# Patient Record
Sex: Male | Born: 1960 | Race: White | Hispanic: No | Smoking: Never smoker
Health system: Southern US, Community
[De-identification: ages and names within clinical notes are randomized; demographics above are authoritative.]

## PROBLEM LIST (undated history)

## (undated) HISTORY — PX: APPENDECTOMY: SHX54

## (undated) HISTORY — PX: HERNIA REPAIR: SHX51

---

## 2006-03-05 ENCOUNTER — Ambulatory Visit: Payer: Self-pay | Admitting: Gastroenterology

## 2007-04-06 ENCOUNTER — Ambulatory Visit: Payer: Self-pay | Admitting: Gastroenterology

## 2011-08-13 ENCOUNTER — Encounter: Payer: Self-pay | Admitting: Emergency Medicine

## 2011-08-21 ENCOUNTER — Encounter: Payer: Self-pay | Admitting: Emergency Medicine

## 2011-08-30 ENCOUNTER — Emergency Department: Payer: Self-pay | Admitting: Emergency Medicine

## 2011-09-08 ENCOUNTER — Ambulatory Visit: Payer: Self-pay | Admitting: Sports Medicine

## 2011-12-08 ENCOUNTER — Ambulatory Visit: Payer: Self-pay | Admitting: Cardiology

## 2012-07-28 ENCOUNTER — Ambulatory Visit: Payer: Self-pay | Admitting: Otolaryngology

## 2013-04-07 ENCOUNTER — Ambulatory Visit: Payer: Self-pay | Admitting: Family Medicine

## 2014-07-29 ENCOUNTER — Emergency Department (INDEPENDENT_AMBULATORY_CARE_PROVIDER_SITE_OTHER)
Admission: EM | Admit: 2014-07-29 | Discharge: 2014-07-30 | Disposition: A | Discharge status: Home / Self Care | Payer: 59 | Source: Home / Self Care | Attending: Family Medicine | Admitting: Family Medicine

## 2014-07-29 ENCOUNTER — Encounter: Payer: Self-pay | Admitting: Emergency Medicine

## 2014-07-29 DIAGNOSIS — M545 Low back pain, unspecified: Secondary | ICD-10-CM

## 2014-07-29 MED ORDER — CYCLOBENZAPRINE HCL 10 MG PO TABS: 10.0000 mg | ORAL_TABLET | Freq: Two times a day (BID) | ORAL | Status: DC | PRN

## 2014-07-29 MED ORDER — MELOXICAM 7.5 MG PO TABS: 7.5000 mg | ORAL_TABLET | Freq: Every day | ORAL | Status: DC

## 2014-07-29 NOTE — ED Notes (Signed)
[facility]/O pain in the lower back times14 days denies injury or problems voiding.

## 2014-07-29 NOTE — ED Provider Notes (Signed)
CSN: [id number]     Arrival date & time 07/29/14  1502 History   First MD Initiated Contact with Patient 07/29/14 1505     Chief Complaint  Patient presents with  . Back Pain   (Consider location/radiation/quality/duration/timing/severity/associated sxs/prior Treatment) HPI  Patient is a 54 year old male presenting to urgent care with complaints of constant, waxing and waning lower back pain for last 2 weeks.  Pain is achy, sore and sharp in nature at times.  Pain is 8/10 in severity at its worst.  Pain is worse with certain movements in certain positions.  Patient states back pain is worse when he gets up in the morning as he feels stiff and then it improves throughout the day as his body loosens up.  Patient states he has been sleeping on a futon for last 4 weeks.  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  Not recall any specific injuries or falls.  Denies prior history of significant back problems.  Denies history of back surgeries.  Denies pain radiating to legs or arms.  No numbness or tingling in his arms or legs.  Denies change in bowel or bladder.  Denies abdominal pain, fever, chills, nausea, vomiting, or diarrhea.  Denies change in balance or gait.  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  Denies history of cancer.  History reviewed. No pertinent past medical history. History reviewed. No pertinent past surgical history. History reviewed. No pertinent family history. History  Substance Use Topics  . Smoking status: Never Smoker   . Smokeless tobacco: Not on file  . Alcohol Use: No    Review of Systems  Constitutional: Negative for fever and chills.  Respiratory: Negative for cough and shortness of breath.   Cardiovascular: Negative for chest pain and palpitations.  Gastrointestinal: Negative for nausea, vomiting, abdominal pain and diarrhea.  Genitourinary: Negative for  dysuria, urgency, hematuria and flank pain.  Musculoskeletal: Positive for back pain. Negative for myalgias, joint swelling, arthralgias, gait problem, neck pain and neck stiffness.  Skin: Negative for rash and wound.  Neurological: Negative for weakness and numbness.  All other systems reviewed and are negative.   Allergies  Review of patient's allergies indicates not on file.  Home Medications   Prior to Admission medications   Medication Sig Start Date End Date Taking? Authorizing Provider  cyclobenzaprine (FLEXERIL) 10 MG tablet Take 1 tablet (10 mg total) by mouth 2 (two) times daily as needed for muscle spasms. 07/29/14   Junius FinnerErin O'Malley, PA-C  meloxicam (MOBIC) 7.5 MG tablet Take 1 tablet (7.5 mg total) by mouth daily. 07/29/14   Junius FinnerErin O'Malley, PA-C   BP 138/91 mmHg  Pulse 101  Temp(Src) 98.5 F (36.9 C) (Oral)  Resp 18  Ht 5\' 10"  (1.778 m)  Wt 245 lb (111.131 kg)  BMI 35.15 kg/m2  SpO2 99% Physical Exam  Constitutional: He is oriented to person, place, and time. He appears well-developed and well-nourished.  Pain sitting in exam chair, appears well, nontoxic.  No acute distress.  HENT:  Head: Normocephalic and atraumatic.  Eyes: EOM are normal.  Neck: Normal range of motion. Neck supple.  Cardiovascular: Normal rate.   Pulmonary/Chest: Effort normal.  Abdominal: Soft. There is no tenderness.  Musculoskeletal: Normal range of motion. He exhibits tenderness. He exhibits no edema.  No midline spinal tenderness.  Mild tenderness to palpation of lumbar muscles bilaterally. Pain  reproducible with certain movements including rotation at the hips as well as bending forward and attempt to touch his toes. Negative straight leg raise bilaterally.  Neurological: He is alert and oriented to person, place, and time.  Reflex Scores:      Patellar reflexes are 2+ on the right side and 2+ on the left side. Sensation in tact, symmetric in upper and lower extremities bilaterally with 5/5  strength in upper and lower extremities bilaterally.   Normal gait  Skin: Skin is warm and dry. No rash noted. No erythema.  Psychiatric: He has a normal mood and affect. His behavior is normal.  Nursing note and vitals reviewed.   ED Course  Procedures (including critical care time) Labs Review Labs Reviewed - No data to display  Imaging Review No results found.   MDM   1. Bilateral low back pain without sciatica    Patient is a 54 year old male presenting to urgent care with gradually worsening lower back pain 14 days.  Pain does not radiate to arms or legs.  No bowel or bladder movement changes.  Patient is afebrile, appears well, nontoxic.  No focal neuro deficits on exam.  Patient has abnormal gait.  Pain is minimally reproducible with palpation to lumbar muscles.  No midline spinal tenderness or other bony tenderness.    No indication for emergent imaging at this time.  No evidence of emergent process taking place at this time including but not limited to cauda equina or spinal abscess.  No urinary symptoms.  Doubt pyelonephritis or UTI.    Rx: flexeril and mobic. Home care instructions including back exercises provided. F/u with PCP in 1 week if not improving, may need referral to PT or orthopedist. Return precautions provided. Pt verbalized understanding and agreement with tx plan.     Junius Finner, PA-C 07/29/14 1557

## 2015-08-15 ENCOUNTER — Emergency Department (INDEPENDENT_AMBULATORY_CARE_PROVIDER_SITE_OTHER)
Admission: EM | Admit: 2015-08-15 | Discharge: 2015-08-15 | Disposition: A | Discharge status: Home / Self Care | Payer: PRIVATE HEALTH INSURANCE | Source: Home / Self Care | Attending: Family Medicine | Admitting: Family Medicine

## 2015-08-15 ENCOUNTER — Encounter: Payer: Self-pay | Admitting: *Deleted

## 2015-08-15 DIAGNOSIS — B029 Zoster without complications: Secondary | ICD-10-CM

## 2015-08-15 MED ORDER — DOXYCYCLINE HYCLATE 100 MG PO CAPS: 100.0000 mg | ORAL_CAPSULE | Freq: Two times a day (BID) | ORAL | 0 refills | Status: DC

## 2015-08-15 MED ORDER — VALACYCLOVIR HCL 1 G PO TABS: 1000.0000 mg | ORAL_TABLET | Freq: Three times a day (TID) | ORAL | 0 refills | Status: DC

## 2015-08-15 NOTE — ED Provider Notes (Signed)
[facility]    CSN: [id number] Arrival date & time: 08/15/15  1903  First Provider Contact:  None       History   Chief Complaint Chief Complaint  Patient presents with  . Abscess    HPI Daniel Crosby is a 55 y.o. male.   Patient believes that he had a spider bite on his right lower back about 5 days ago.  He visited an urgent care clinic in [place] 2 days ago where he received a steroid shot with some relief.  He still has a rash at the site.  He feels well otherwise.  No fevers, chills, and sweats.   The history is provided by the patient.  Rash  Location:  Torso Torso rash location:  Lower back Quality: painful and redness   Quality: not blistering, not bruising, not burning, not draining, not dry, not itchy, not swelling and not weeping   Pain details:    Quality:  Aching   Severity:  Mild   Onset quality:  Sudden   Duration:  5 days   Timing:  Constant   Progression:  Unchanged Severity:  Mild Onset quality:  Sudden Duration:  5 days Timing:  Constant Progression:  Unchanged Chronicity:  New Context: insect bite/sting   Context: not animal contact, not chemical exposure, not exposure to similar rash, not food, not hot tub use, not medications, not new detergent/soap, not nuts, not plant contact, not pollen, not sick contacts and not sun exposure   Relieved by:  Nothing Worsened by:  Nothing Ineffective treatments: steroid shot. Associated symptoms: induration   Associated symptoms: no abdominal pain, no diarrhea, no fatigue, no fever, no headaches, no joint pain, no myalgias, no nausea, no shortness of breath, no sore throat, no throat swelling, no URI, not vomiting and not wheezing     History reviewed. No pertinent past medical history.  There are no active problems to display for this patient.   Past Surgical History:  Procedure Laterality Date  . APPENDECTOMY    . HERNIA REPAIR         Home Medications    Prior to Admission  medications   Medication Sig Start Date End Date Taking? Authorizing Provider  doxycycline (VIBRAMYCIN) 100 MG capsule Take 1 capsule (100 mg total) by mouth 2 (two) times daily. Take with food. 08/15/15   Lattie Haw, MD  valACYclovir (VALTREX) 1000 MG tablet Take 1 tablet (1,000 mg total) by mouth 3 (three) times daily. 08/15/15   Lattie Haw, MD    Family History Family History  Problem Relation Age of Onset  . Hypertension Mother   . Diabetes Mother   . Hypertension Father     Social History Social History  Substance Use Topics  . Smoking status: Never Smoker  . Smokeless tobacco: Never Used  . Alcohol use No     Allergies   Review of patient's allergies indicates no known allergies.   Review of Systems Review of Systems  Constitutional: Negative for fatigue and fever.  HENT: Negative for sore throat.   Respiratory: Negative for shortness of breath and wheezing.   Gastrointestinal: Negative for abdominal pain, diarrhea, nausea and vomiting.  Musculoskeletal: Negative for arthralgias and myalgias.  Skin: Positive for rash.  Neurological: Negative for headaches.  All other systems reviewed and are negative.    Physical Exam Triage Vital Signs ED Triage Vitals  Enc Vitals Group     BP 08/15/15 1934 155/85  Pulse Rate 08/15/15 1934 101     Resp 08/15/15 1934 18     Temp 08/15/15 1934 98.5 F (36.9 C)     Temp Source 08/15/15 1934 Oral     SpO2 08/15/15 1934 96 %     Weight 08/15/15 1934 240 lb (108.9 kg)     Height 08/15/15 1934  (1.778 m)     Head Circumference --      Peak Flow --      Pain Score 08/15/15 1936 0     Pain Loc --      Pain Edu? --      Excl. in GC? --    No data found.   Updated Vital Signs BP 155/85 (BP Location: Left Arm)   Pulse 101   Temp 98.5 F (36.9 C) (Oral)   Resp 18   Ht  (1.778 m)   Wt 240 lb (108.9 kg)   SpO2 96%   BMI 34.44 kg/m   Visual Acuity Right Eye Distance:   Left Eye Distance:     Bilateral Distance:    Right Eye Near:   Left Eye Near:    Bilateral Near:     Physical Exam  Constitutional: He is oriented to person, place, and time. He appears well-developed and well-nourished. No distress.  HENT:  Head: Normocephalic.  Nose: Nose normal.  Mouth/Throat: Oropharynx is clear and moist. No oropharyngeal exudate.  Eyes: EOM are normal. Pupils are equal, round, and reactive to light.  Neck: Neck supple.  Cardiovascular: Normal heart sounds.   Pulmonary/Chest: Breath sounds normal.  Abdominal: There is no tenderness.  Musculoskeletal: He exhibits no edema.  Neurological: He is alert and oriented to person, place, and time.  Skin: Skin is warm and dry. Rash noted.     Right lower back has a 6cm by 8cm patch of erythema with central 2cm by 2.5cm vesiculated area.  No induration or fluctuance.  Nursing note and vitals reviewed.    UC Treatments / Results  Labs (all labs ordered are listed, but only abnormal results are displayed) Labs Reviewed - No data to display  EKG  EKG Interpretation None       Radiology No results found.  Procedures Procedures (including critical care time)  Medications Ordered in UC Medications - No data to display   Initial Impression / Assessment and Plan / UC Course  I have reviewed the triage vital signs and the nursing notes.  Pertinent labs & imaging results that were available during my care of the patient were reviewed by me and considered in my medical decision making (see chart for details).  Clinical Course   Begin Valtrex.  Will begin empiric doxycycline for staph coverage. Followup with Family Doctor if not improved in one week.     Final Clinical Impressions(s) / UC Diagnoses   Final diagnoses:  Herpes zoster     New Prescriptions New Prescriptions   DOXYCYCLINE (VIBRAMYCIN) 100 MG CAPSULE    Take 1 capsule (100 mg total) by mouth 2 (two) times daily. Take with food.   VALACYCLOVIR (VALTREX)  1000 MG TABLET    Take 1 tablet (1,000 mg total) by mouth 3 (three) times daily.     Lattie Haw, MD 09/13/15 [phone]

## 2015-08-15 NOTE — ED Triage Notes (Signed)
Pt c/o abscess to his RT side lower back x 4 days. He was seen at an urgent care in [place] 2 days ago and was given a steroid shot with some relief. Denies fever.

## 2016-02-03 ENCOUNTER — Emergency Department: Payer: Worker's Compensation

## 2016-02-03 ENCOUNTER — Emergency Department
Admission: EM | Admit: 2016-02-03 | Discharge: 2016-02-03 | Disposition: A | Discharge status: Home / Self Care | Payer: Worker's Compensation | Attending: Emergency Medicine | Admitting: Emergency Medicine

## 2016-02-03 DIAGNOSIS — Y92000 Kitchen of unspecified non-institutional (private) residence as  the place of occurrence of the external cause: Secondary | ICD-10-CM | POA: Insufficient documentation

## 2016-02-03 DIAGNOSIS — X501XXA Overexertion from prolonged static or awkward postures, initial encounter: Secondary | ICD-10-CM | POA: Diagnosis not present

## 2016-02-03 DIAGNOSIS — S60222A Contusion of left hand, initial encounter: Secondary | ICD-10-CM | POA: Diagnosis not present

## 2016-02-03 DIAGNOSIS — S6992XA Unspecified injury of left wrist, hand and finger(s), initial encounter: Secondary | ICD-10-CM | POA: Diagnosis present

## 2016-02-03 DIAGNOSIS — Y999 Unspecified external cause status: Secondary | ICD-10-CM | POA: Insufficient documentation

## 2016-02-03 DIAGNOSIS — Y9389 Activity, other specified: Secondary | ICD-10-CM | POA: Insufficient documentation

## 2016-02-03 MED ORDER — NAPROXEN 500 MG PO TABS: 500.0000 mg | ORAL_TABLET | Freq: Two times a day (BID) | ORAL | 0 refills | Status: AC

## 2016-02-03 NOTE — ED Notes (Signed)
See triage note.  Felt a pop in left hand  Pain is mainly in left thumb area moving into arm  positive pulses  No deformity

## 2016-02-03 NOTE — ED Notes (Signed)
Left hand pain per pt - states the redness and warmth is gone from original injury, now pain is when he touches his thumb to his pinky on that hand. No redness or swelling noted

## 2016-02-03 NOTE — ED Provider Notes (Signed)
[facility] Emergency Department Provider Note  ____________________________________________  Time seen: Approximately 6:30 PM  I have reviewed the triage vital signs and the nursing notes.   HISTORY  Chief Complaint Hand Pain    HPI Daniel Crosby is a 56 y.o. male, NAD, presents to the emergency department with her evaluation of left hand pain. States that today while wringing out a towel at work, he felt a sharp pain at the base of his left thumb and saw "the vein was popping out." He cleans carts for work, and thinks he also "may have hit his hand" this afternoon. At this time he only experiences pain in that area when he moves his thumb, and feels a shooting pain from the wrist up the arm into the armpit. Has not taken anything for the pain. He denies any other falls or injuries. He denies numbness and tingling. Has had full range of motion of the wrist, hand and fingers since the incident. Denies any open wounds or lacerations. Has not noted any abnormal redness or warmth.  No past medical history on file.  There are no active problems to display for this patient.   Past Surgical History:  Procedure Laterality Date  . APPENDECTOMY    . HERNIA REPAIR      Prior to Admission medications   Medication Sig Start Date End Date Taking? Authorizing Provider  naproxen (NAPROSYN) 500 MG tablet Take 1 tablet (500 mg total) by mouth 2 (two) times daily with a meal. 02/03/16   Jami L Hagler, PA-C    Allergies Patient has no known allergies.  Family History  Problem Relation Age of Onset  . Hypertension Mother   . Diabetes Mother   . Hypertension Father     Social History Social History  Substance Use Topics  . Smoking status: Never Smoker  . Smokeless tobacco: Never Used  . Alcohol use No     Review of Systems  Constitutional: No fever/chills Musculoskeletal: Positive for left thumb and hand pain. Negative for wrist pain.  Skin: Positive for bruise  on palm of left hand. Negative for rash, redness, swelling, open wounds or lacerations. Neurological: Negative for numbness, weakness, tingling.  ____________________________________________   PHYSICAL EXAM:  VITAL SIGNS: ED Triage Vitals [02/03/16 1752]  Enc Vitals Group     BP (!) 183/93     Pulse Rate 92     Resp 20     Temp 97.5 F (36.4 C)     Temp Source Oral     SpO2 95 %     Weight 245 lb (111.1 kg)     Height 5\' 10"  (1.778 m)     Head Circumference      Peak Flow      Pain Score 4     Pain Loc      Pain Edu?      Excl. in GC?      Constitutional: Alert and oriented. Well appearing and in no acute distress. Eyes: Conjunctivae are normal.  Head: Atraumatic. Cardiovascular: Good peripheral circulation with 2+ pulses noted in the left upper extremity. Capillary refill is brisk in all digits of the left hand. Respiratory: Normal respiratory effort without tachypnea or retractions.  Musculoskeletal: Full range of motion of the left wrist, hand and fingers without difficulty. Pain about the left thenar eminence with full flexion of the left thumb. The bony abnormalities or crepitus noted about the left hand or fingers. No effusions. Strength of all digits on the  left hand is 5 out of 5 and equal. Neurologic: Normal speech and language. No gross focal neurologic deficits are appreciated. Sensation to light touch grossly intact about left upper extremity. Skin:  Blue ecchymosis noted about the left thenar eminence with mild swelling. No abnormal warmth, redness, open wounds or lacerations. Skin is warm, dry and intact. No rash noted. Psychiatric: Mood and affect are normal. Speech and behavior are normal. Patient exhibits appropriate insight and judgement.   ____________________________________________   LABS  None ____________________________________________  EKG  None ____________________________________________  RADIOLOGY I, Hope PigeonJami L Hagler, personally viewed and  evaluated these images (plain radiographs) as part of my medical decision making, as well as reviewing the written report by the radiologist.  Dg Hand Complete Left  Result Date: 02/03/2016 CLINICAL DATA:  Pain in the palm of this hand after injury. EXAM: LEFT HAND - COMPLETE 3+ VIEW COMPARISON:  None. FINDINGS: There is no evidence of fracture or dislocation. There is no evidence of arthropathy or other focal bone abnormality. Soft tissues are unremarkable. IMPRESSION: Negative. Electronically Signed   By: Kennith CenterEric  Mansell M.D.   On: 02/03/2016 18:53    ____________________________________________    PROCEDURES  Procedure(s) performed: None   Procedures   Medications - No data to display   ____________________________________________   INITIAL IMPRESSION / ASSESSMENT AND PLAN / ED COURSE  Pertinent labs & imaging results that were available during my care of the patient were reviewed by me and considered in my medical decision making (see chart for details).  Clinical Course     Patient's diagnosis is consistent with Contusion of left hand. Patient will be discharged home with prescriptions for Naprosyn to take as directed. Patient is to apply ice to the affected area 20 minutes 3-4 times daily as discussed. Patient also encouraged to purchase a thumb spica splint to wear while working. Patient is to follow up with Dr. Martha ClanKrasinski in orthopedics if symptoms persist past this treatment course. Patient is given ED precautions to return to the ED for any worsening or new symptoms.    ____________________________________________  FINAL CLINICAL IMPRESSION(S) / ED DIAGNOSES  Final diagnoses:  Contusion of left hand, initial encounter      NEW MEDICATIONS STARTED DURING THIS VISIT:  Discharge Medication List as of 02/03/2016  7:23 PM    START taking these medications   Details  naproxen (NAPROSYN) 500 MG tablet Take 1 tablet (500 mg total) by mouth 2 (two) times daily with a  meal., [place] 02/03/2016, Print             Hope PigeonJami L Hagler, PA-C 02/03/16 2045    Emily FilbertJonathan E Williams, MD 02/03/16 2224

## 2016-02-03 NOTE — ED Notes (Signed)
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 No laceration noted.

## 2016-02-03 NOTE — Discharge Instructions (Signed)
Purchase a thumb spica splint to wear over the next few days.   Apply ice to the affected area 20 minutes 3-4 times daily to decrease bruising and swelling.  Take medications as prescribed.

## 2016-02-03 NOTE — ED Triage Notes (Signed)
Pt states that while working today he was wringing out a towel and felt a pop in his left hand that radiated up his left arm to his armpit, pt states his left thumb and palm of left hand hurt, area of bruising noted

## 2018-04-22 IMAGING — DX DG HAND COMPLETE 3+V*L*
3 series · 3 of 3 positions shown · non-contrast
Comparison: None.

CLINICAL DATA: Pain in the palm of this hand after injury.

EXAM:
LEFT HAND - COMPLETE 3+ VIEW

[hand ap]
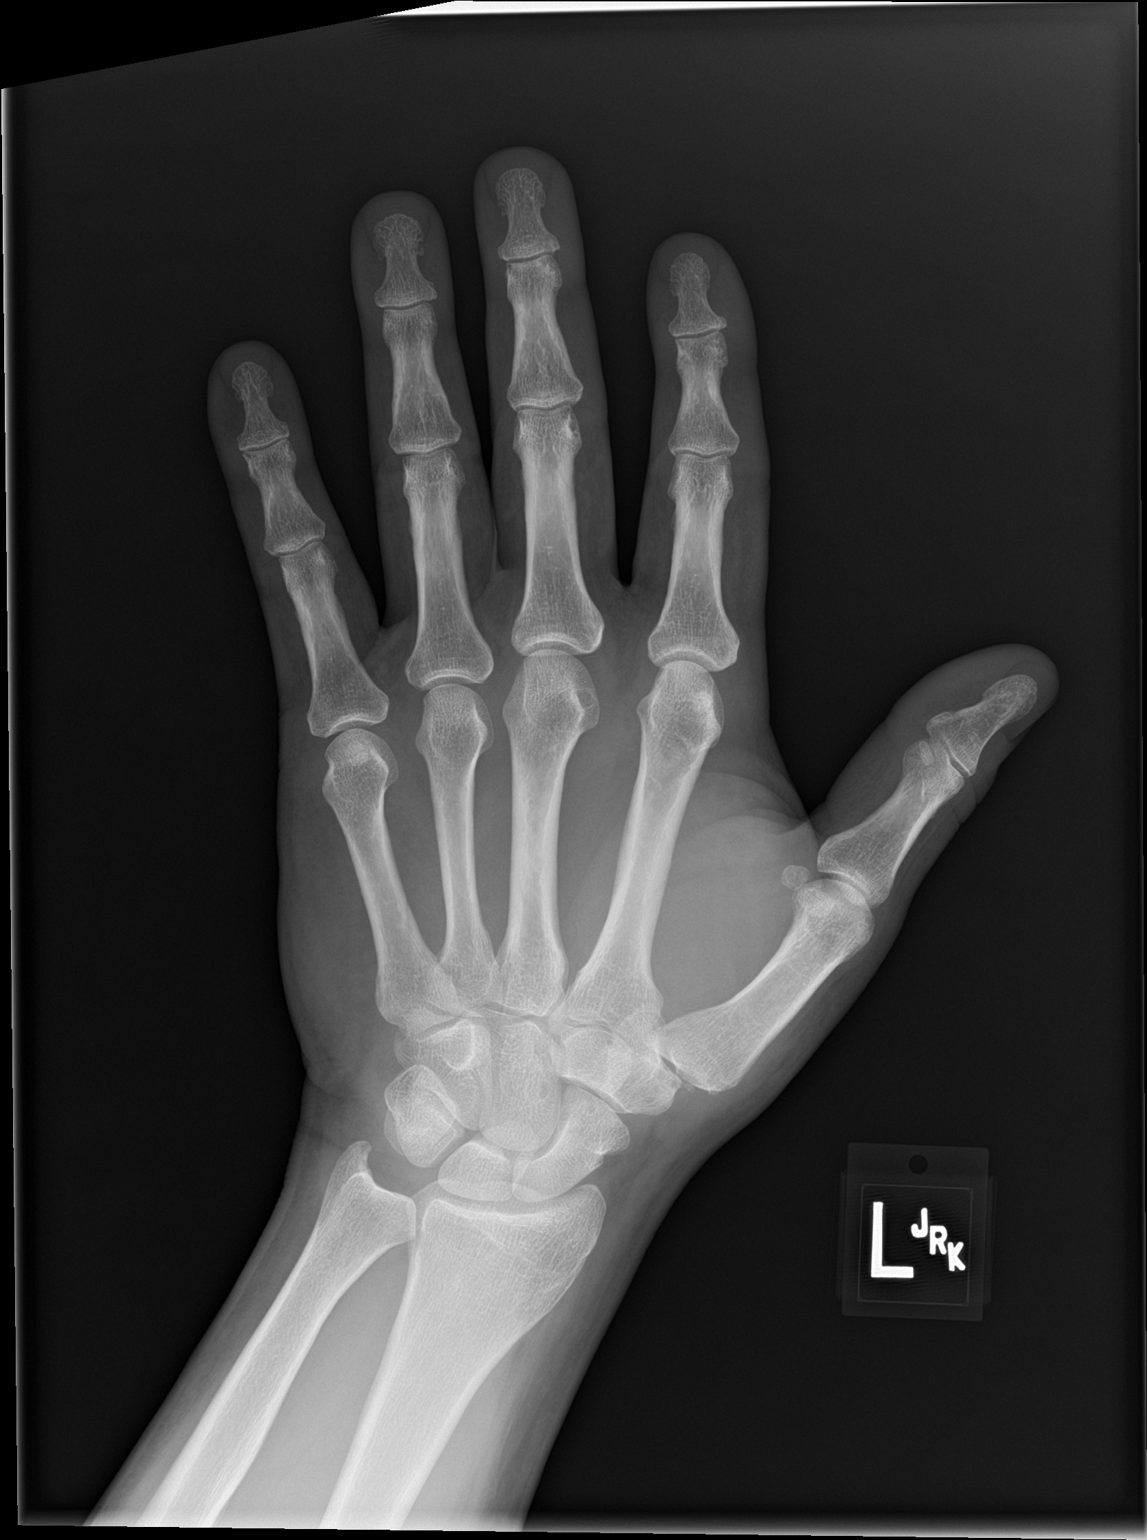

[hand obl]
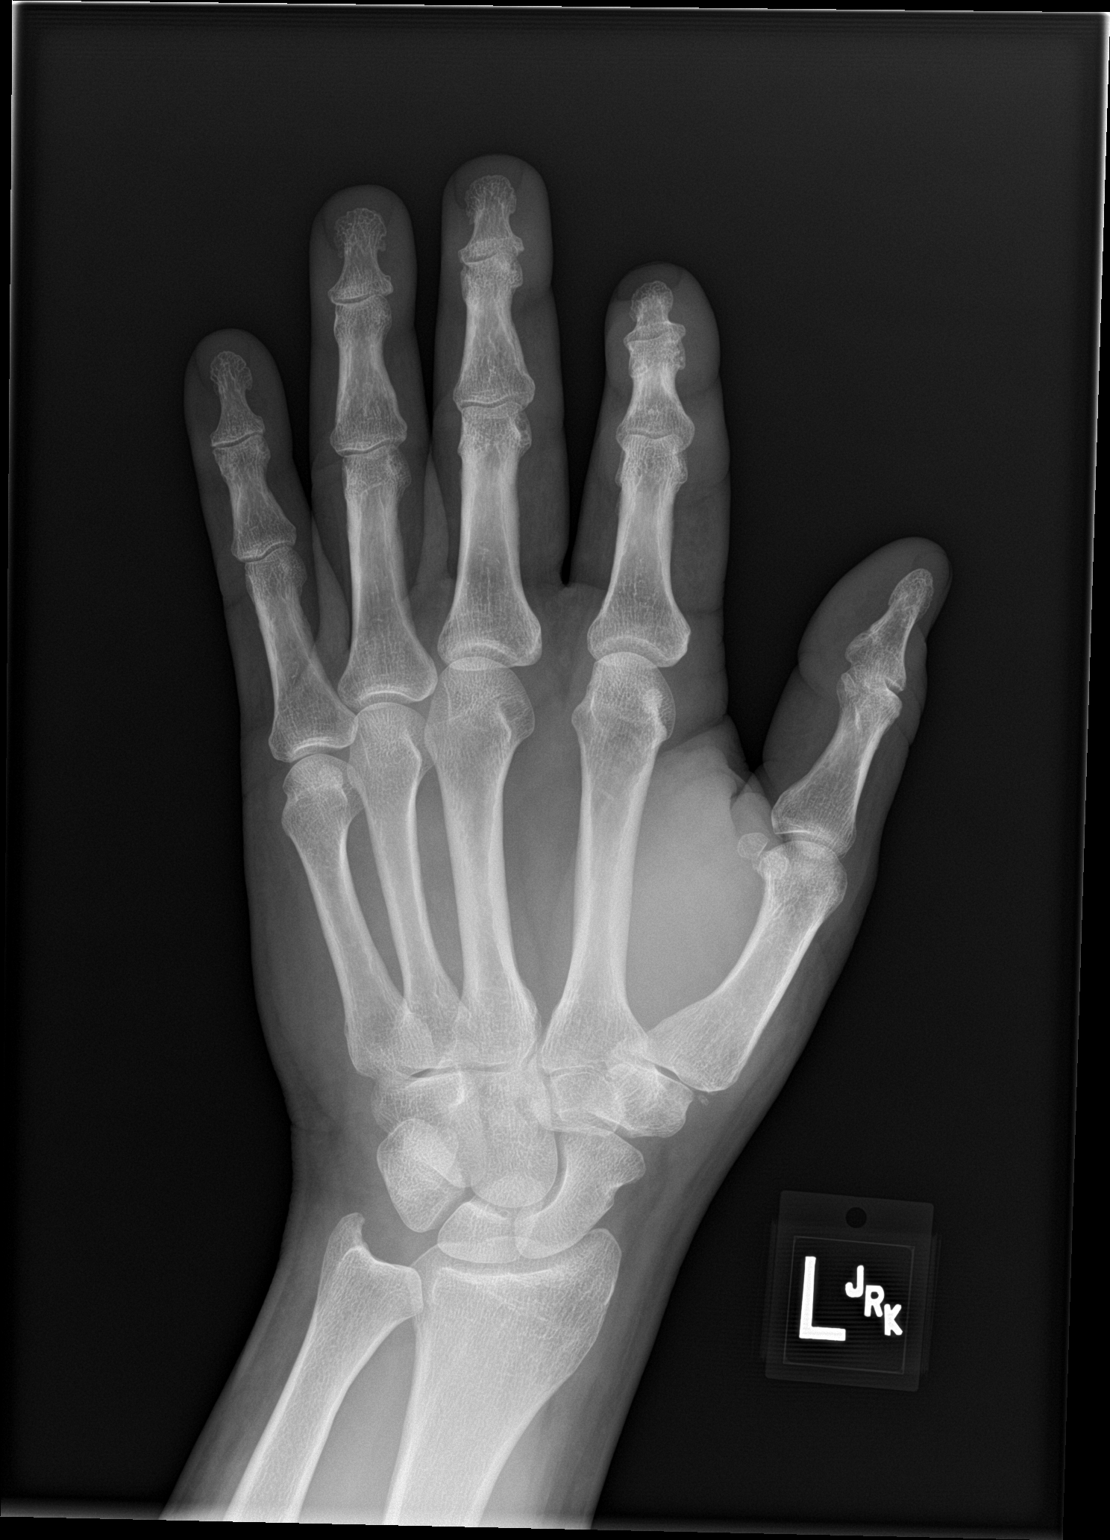

[hand lat]
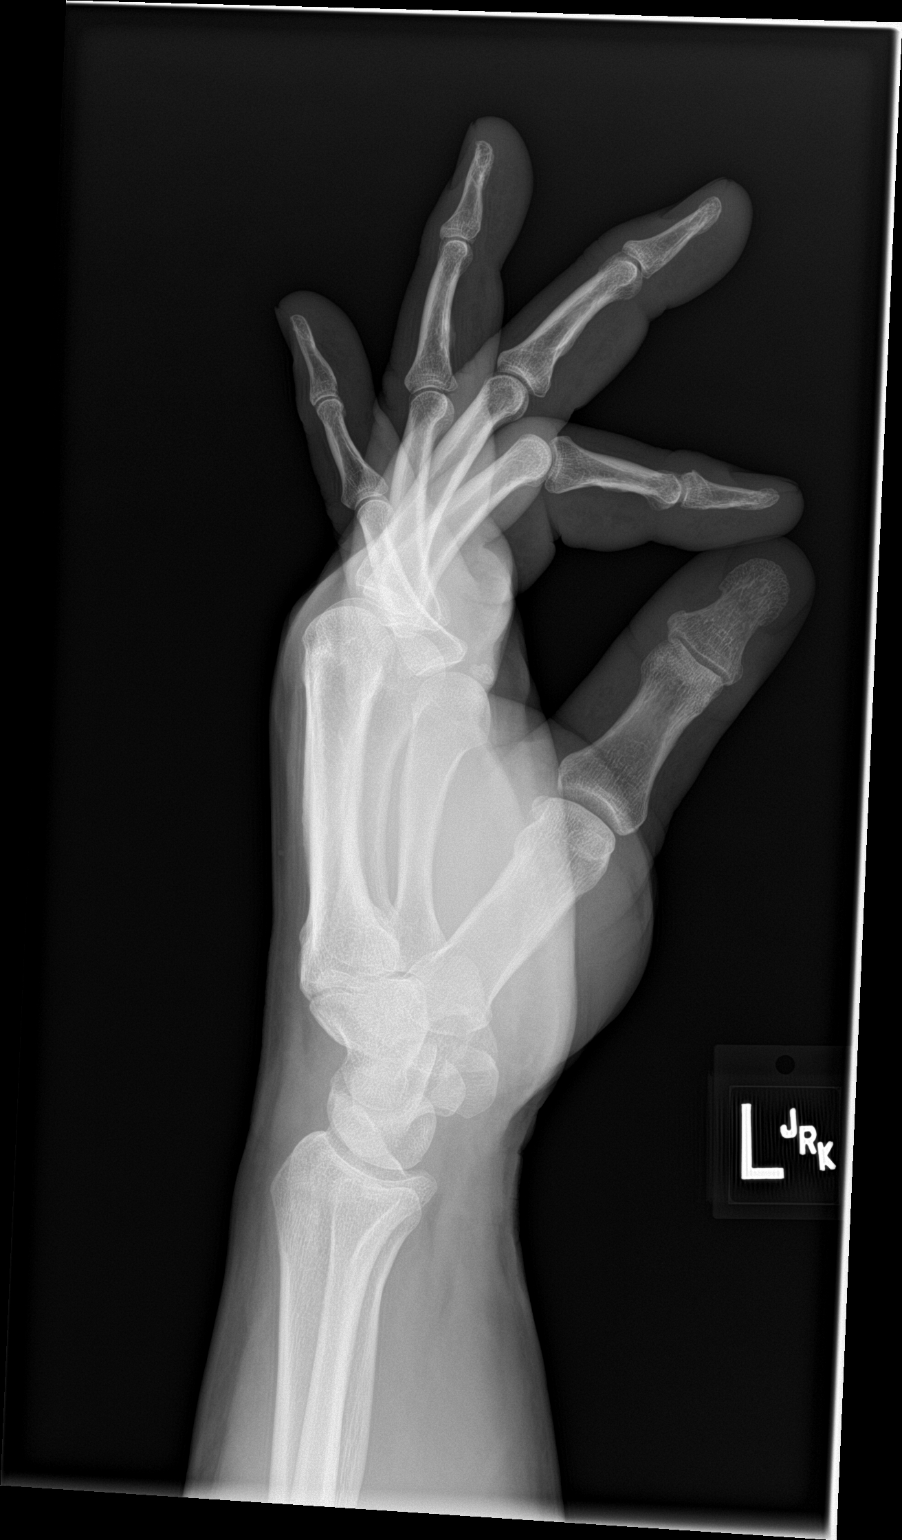

[3 of 3 positions shown; findings below may reference images not displayed]

FINDINGS: There is no evidence of fracture or dislocation. There is no
evidence of arthropathy or other focal bone abnormality. Soft
tissues are unremarkable.
IMPRESSION: Negative.

## 2019-08-11 ENCOUNTER — Other Ambulatory Visit: Payer: Self-pay

## 2019-08-11 ENCOUNTER — Ambulatory Visit: Payer: BC Managed Care – PPO | Attending: Family Medicine

## 2019-08-11 DIAGNOSIS — M25561 Pain in right knee: Secondary | ICD-10-CM | POA: Insufficient documentation

## 2019-08-11 NOTE — Therapy (Signed)
[facility] MAIN [facility] SERVICES [address] 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 Pt stated he was cutting grass, stepped into a hole, over a month ago. Pt did report swelling initially. Pt reported throughout the day his pain increases, and increases with knee flexion. Has concerns about his knee potentially giving way, unsteadiness.  Injury Date: 07/12/2019 Pain Scale: 2/10. At worst: 10/10, best: 0/10, but main complaint is confidence that it will not give out   Assessment:  Palpation Pain with palpation over medial joint line  Strength R/L 5/5 Hip flexion  5/5 Hip abduction 5/5 Hip adduction 5/5 Knee extension 5/5 Knee flexion 5/5 Ankle Dorsiflexion 5/5 Ankle Plantarflexion   SPECIAL TESTS  Ligamentous Stability  Anterior Drawer Test: Negative Lachman Test: Not examined Posterior Drawer Test: Negative Posterior Sag Sign: Negative Valgus Stress Test: Negative Varus Stress Test: Negative McMurray Test: + Thessaly Test: + Eges test: +    Recommendations:    [x]  Patient would benefit from an MD referral []  Patient would benefit from a full PT/OT/ SLP evaluation and treatment. []  No intervention recommended at this time.  PT, DPT 5:27 PM,08/11/19    [facility] MAIN [facility] SERVICES [address] [place], [facility], [address] Phone: [phone]   Fax:  [phone]

## 2021-10-15 ENCOUNTER — Other Ambulatory Visit: Payer: Self-pay | Admitting: Nurse Practitioner

## 2021-10-15 ENCOUNTER — Other Ambulatory Visit (HOSPITAL_BASED_OUTPATIENT_CLINIC_OR_DEPARTMENT_OTHER): Payer: Self-pay | Admitting: Nurse Practitioner

## 2021-10-15 DIAGNOSIS — R918 Other nonspecific abnormal finding of lung field: Secondary | ICD-10-CM

## 2021-10-18 ENCOUNTER — Ambulatory Visit
Admission: RE | Admit: 2021-10-18 | Discharge: 2021-10-18 | Disposition: A | Discharge status: Home / Self Care | Payer: 59 | Source: Ambulatory Visit | Attending: Nurse Practitioner | Admitting: Nurse Practitioner

## 2021-10-18 DIAGNOSIS — R918 Other nonspecific abnormal finding of lung field: Secondary | ICD-10-CM | POA: Insufficient documentation

## 2021-10-18 LAB — POCT I-STAT CREATININE: Creatinine, Ser: 1 mg/dL (ref 0.61–1.24)

## 2021-10-18 MED ORDER — IOHEXOL 300 MG/ML  SOLN
75.0000 mL | Freq: Once | INTRAMUSCULAR | Status: AC | PRN
  Administered 2021-10-18: 75 mL via INTRAVENOUS

## 2021-11-08 LAB — COLOGUARD: COLOGUARD: NEGATIVE

## 2023-07-08 ENCOUNTER — Encounter: Payer: No Typology Code available for payment source | Attending: Internal Medicine

## 2023-07-08 ENCOUNTER — Other Ambulatory Visit: Payer: Self-pay

## 2023-07-08 DIAGNOSIS — Z952 Presence of prosthetic heart valve: Secondary | ICD-10-CM | POA: Insufficient documentation

## 2023-07-08 DIAGNOSIS — Z95828 Presence of other vascular implants and grafts: Secondary | ICD-10-CM

## 2023-07-08 DIAGNOSIS — Z48812 Encounter for surgical aftercare following surgery on the circulatory system: Secondary | ICD-10-CM | POA: Insufficient documentation

## 2023-07-08 NOTE — Progress Notes (Signed)
 Virtual Visit completed. Patient informed on EP and RD appointment and 6 Minute walk test. Patient also informed of patient health questionnaires on My Chart. Patient Verbalizes understanding. Visit diagnosis can be found in [facility] 06/01/2023.

## 2023-07-13 ENCOUNTER — Encounter

## 2023-07-13 VITALS — Ht 71.0 in | Wt 228.1 lb

## 2023-07-13 DIAGNOSIS — Z952 Presence of prosthetic heart valve: Secondary | ICD-10-CM

## 2023-07-13 DIAGNOSIS — Z48812 Encounter for surgical aftercare following surgery on the circulatory system: Secondary | ICD-10-CM | POA: Diagnosis present

## 2023-07-13 NOTE — Progress Notes (Signed)
 Cardiac Individual Treatment Plan  Patient Details  Name: Daniel Crosby MRN: [id number] Date of Birth: 24-Jul-1960 Referring Provider:   Flowsheet Row Cardiac Rehab from 07/13/2023 in [facility] Cardiac and Pulmonary Rehab  Referring Provider Dr. Georgine Mc    Initial Encounter Date:  Flowsheet Row Cardiac Rehab from 07/13/2023 in [facility] Cardiac and Pulmonary Rehab  Date 07/13/23    Visit Diagnosis: S/P TAVR (transcatheter aortic valve replacement)  Patient's Home Medications on Admission:  Current Outpatient Medications:    amLODipine (NORVASC) 10 MG tablet, Take 10 mg by mouth daily., Disp: , Rfl:    aspirin EC 81 MG tablet, Take 81 mg by mouth., Disp: , Rfl:    citalopram (CELEXA) 40 MG tablet, Take 40 mg by mouth., Disp: , Rfl:    DULoxetine (CYMBALTA) 60 MG capsule, Take 60 mg by mouth., Disp: , Rfl:    famotidine (PEPCID) 20 MG tablet, Take 20 mg by mouth 2 (two) times daily., Disp: , Rfl:    fluticasone (FLONASE) 50 MCG/ACT nasal spray, 2 sprays., Disp: , Rfl:    furosemide (LASIX) 40 MG tablet, Take 40 mg by mouth., Disp: , Rfl:    lisinopril (ZESTRIL) 40 MG tablet, Take 40 mg by mouth daily., Disp: , Rfl:    metFORMIN (GLUCOPHAGE) 500 MG tablet, TAKE 1 TABLET WITH         BREAKFAST, Disp: , Rfl:    naproxen  (NAPROSYN ) 500 MG tablet, Take 1 tablet (500 mg total) by mouth 2 (two) times daily with a meal., Disp: 14 tablet, Rfl: 0   oxyCODONE (OXY IR/ROXICODONE) 5 MG immediate release tablet, Take by mouth. (Patient not taking: Reported on 07/08/2023), Disp: , Rfl:    potassium chloride SA (KLOR-CON M) 20 MEQ tablet, Take 20 mEq by mouth., Disp: , Rfl:    pravastatin (PRAVACHOL) 40 MG tablet, SMARTSIG:1 Tablet(s) By Mouth Every Evening, Disp: , Rfl:    pravastatin (PRAVACHOL) 80 MG tablet, Take 80 mg by mouth., Disp: , Rfl:    Probiotic Product (PROBIOTIC DAILY) CAPS, Take 1 tablet by mouth every evening., Disp: , Rfl:    traZODone (DESYREL) 50 MG tablet, Take 50 mg by mouth., Disp: , Rfl:     warfarin (COUMADIN) 10 MG tablet, Take 10 mg by mouth., Disp: , Rfl:   Past Medical History: No past medical history on file.  Tobacco Use: Social History   Tobacco Use  Smoking Status Never  Smokeless Tobacco Current   Types: Snuff    Labs: Review Flowsheet        No data to display           Exercise Target Goals: Exercise Program Goal: Individual exercise prescription set using results from initial 6 min walk test and THRR while considering  patient's activity barriers and safety.   Exercise Prescription Goal: Initial exercise prescription builds to 30-45 minutes a day of aerobic activity, 2-3 days per week.  Home exercise guidelines will be given to patient during program as part of exercise prescription that the participant will acknowledge.   Education: Aerobic Exercise: - Group verbal and visual presentation on the components of exercise prescription. Introduces F.I.T.T principle from ACSM for exercise prescriptions.  Reviews F.I.T.T. principles of aerobic exercise including progression. Written material given at graduation. Flowsheet Row Cardiac Rehab from 07/13/2023 in [facility] Cardiac and Pulmonary Rehab  Education need identified 07/13/23    Education: Resistance Exercise: - Group verbal and visual presentation on the components of exercise prescription. Introduces F.I.T.T principle from ACSM for  exercise prescriptions  Reviews F.I.T.T. principles of resistance exercise including progression. Written material given at graduation.    Education: Exercise & Equipment Safety: - Individual verbal instruction and demonstration of equipment use and safety with use of the equipment. Flowsheet Row Cardiac Rehab from 07/13/2023 in [facility] Cardiac and Pulmonary Rehab  Date 07/13/23  Educator [facility]  Instruction Review Code 1- Verbalizes Understanding    Education: Exercise Physiology & General Exercise Guidelines: - Group verbal and written instruction with models to review  the exercise physiology of the cardiovascular system and associated critical values. Provides general exercise guidelines with specific guidelines to those with heart or lung disease.    Education: Flexibility, Balance, Mind/Body Relaxation: - Group verbal and visual presentation with interactive activity on the components of exercise prescription. Introduces F.I.T.T principle from ACSM for exercise prescriptions. Reviews F.I.T.T. principles of flexibility and balance exercise training including progression. Also discusses the mind body connection.  Reviews various relaxation techniques to help reduce and manage stress (i.e. Deep breathing, progressive muscle relaxation, and visualization). Balance handout provided to take home. Written material given at graduation.   Activity Barriers & Risk Stratification:  Activity Barriers & Cardiac Risk Stratification - 07/13/23 1148       Activity Barriers & Cardiac Risk Stratification   Activity Barriers Joint Problems    Cardiac Risk Stratification Low          6 Minute Walk:  6 Minute Walk     Row Name 07/13/23 1145         6 Minute Walk   Phase Initial     Distance 1555 feet     Walk Time 6 minutes     # of Rest Breaks 0     MPH 2.9     METS 3.96     RPE 9     Perceived Dyspnea  0     VO2 Peak 13.87     Symptoms No     Resting HR 102 bpm     Resting BP 128/72     Resting Oxygen Saturation  95 %     Exercise Oxygen Saturation  during 6 min walk 97 %     Max Ex. HR 114 bpm     Max Ex. BP 156/74     2 Minute Post BP 134/72        Oxygen Initial Assessment:   Oxygen Re-Evaluation:   Oxygen Discharge (Final Oxygen Re-Evaluation):   Initial Exercise Prescription:  Initial Exercise Prescription - 07/13/23 1100       Date of Initial Exercise RX and Referring Provider   Date 07/13/23    Referring Provider Dr. Georgine Alemu      Oxygen   Maintain Oxygen Saturation 88% or higher      Treadmill   MPH 3    Grade 2     Minutes 15    METs 4.12      Elliptical   Level 1    Speed 3    Minutes 15    METs 3.96      Rower   Level 3    Watts 25    Minutes 15    METs 3.96      Prescription Details   Duration Progress to 30 minutes of continuous aerobic without signs/symptoms of physical distress      Intensity   THRR 40-80% of Max Heartrate 124-147    Ratings of Perceived Exertion 11-13    Perceived Dyspnea 0-4  Progression   Progression Continue to progress workloads to maintain intensity without signs/symptoms of physical distress.      Resistance Training   Training Prescription Yes    Weight 5lb    Reps 10-15          Perform Capillary Blood Glucose checks as needed.  Exercise Prescription Changes:   Exercise Prescription Changes     Row Name 07/13/23 1100             Response to Exercise   Blood Pressure (Admit) 128/72       Blood Pressure (Exercise) 156/74       Blood Pressure (Exit) 134/72       Heart Rate (Admit) 102 bpm       Heart Rate (Exercise) 114 bpm       Heart Rate (Exit) 97 bpm       Oxygen Saturation (Admit) 95 %       Oxygen Saturation (Exercise) 97 %       Oxygen Saturation (Exit) 97 %       Rating of Perceived Exertion (Exercise) 9       Perceived Dyspnea (Exercise) 0       Symptoms none       Comments results          Exercise Comments:   Exercise Goals and Review:   Exercise Goals     Row Name 07/13/23 1151             Exercise Goals   Increase Physical Activity Yes       Intervention Provide advice, education, support and counseling about physical activity/exercise needs.;Develop an individualized exercise prescription for aerobic and resistive training based on initial evaluation findings, risk stratification, comorbidities and participant's personal goals.       Expected Outcomes Short Term: Attend rehab on a regular basis to increase amount of physical activity.;Long Term: Add in home exercise to make exercise part of  routine and to increase amount of physical activity.;Long Term: Exercising regularly at least 3-5 days a week.       Increase Strength and Stamina Yes       Intervention Develop an individualized exercise prescription for aerobic and resistive training based on initial evaluation findings, risk stratification, comorbidities and participant's personal goals.;Provide advice, education, support and counseling about physical activity/exercise needs.       Expected Outcomes Short Term: Increase workloads from initial exercise prescription for resistance, speed, and METs.;Short Term: Perform resistance training exercises routinely during rehab and add in resistance training at home;Long Term: Improve cardiorespiratory fitness, muscular endurance and strength as measured by increased METs and functional capacity ( )       Able to understand and use rate of perceived exertion (RPE) scale Yes       Intervention Provide education and explanation on how to use RPE scale       Expected Outcomes Short Term: Able to use RPE daily in rehab to express subjective intensity level;Long Term:  Able to use RPE to guide intensity level when exercising independently       Able to understand and use Dyspnea scale Yes       Intervention Provide education and explanation on how to use Dyspnea scale       Expected Outcomes Short Term: Able to use Dyspnea scale daily in rehab to express subjective sense of shortness of breath during exertion;Long Term: Able to use Dyspnea scale to guide intensity level when exercising independently  Knowledge and understanding of Target Heart Rate Range (THRR) Yes       Intervention Provide education and explanation of THRR including how the numbers were predicted and where they are located for reference       Expected Outcomes Short Term: Able to state/look up THRR;Long Term: Able to use THRR to govern intensity when exercising independently;Short Term: Able to use daily as guideline for  intensity in rehab       Able to check pulse independently Yes       Intervention Provide education and demonstration on how to check pulse in carotid and radial arteries.;Review the importance of being able to check your own pulse for safety during independent exercise       Expected Outcomes Short Term: Able to explain why pulse checking is important during independent exercise;Long Term: Able to check pulse independently and accurately       Understanding of Exercise Prescription Yes       Intervention Provide education, explanation, and written materials on patient's individual exercise prescription       Expected Outcomes Short Term: Able to explain program exercise prescription;Long Term: Able to explain home exercise prescription to exercise independently          Exercise Goals Re-Evaluation :   Discharge Exercise Prescription (Final Exercise Prescription Changes):  Exercise Prescription Changes - 07/13/23 1100       Response to Exercise   Blood Pressure (Admit) 128/72    Blood Pressure (Exercise) 156/74    Blood Pressure (Exit) 134/72    Heart Rate (Admit) 102 bpm    Heart Rate (Exercise) 114 bpm    Heart Rate (Exit) 97 bpm    Oxygen Saturation (Admit) 95 %    Oxygen Saturation (Exercise) 97 %    Oxygen Saturation (Exit) 97 %    Rating of Perceived Exertion (Exercise) 9    Perceived Dyspnea (Exercise) 0    Symptoms none    Comments results          Nutrition:  Target Goals: Understanding of nutrition guidelines, daily intake of sodium 1500mg , cholesterol 200mg , calories 30% from fat and 7% or less from saturated fats, daily to have 5 or more servings of fruits and vegetables.  Education: All About Nutrition: -Group instruction provided by verbal, written material, interactive activities, discussions, models, and posters to present general guidelines for heart healthy nutrition including fat, fiber, MyPlate, the role of sodium in heart healthy nutrition,  utilization of the nutrition label, and utilization of this knowledge for meal planning. Follow up email sent as well. Written material given at graduation. Flowsheet Row Cardiac Rehab from 07/13/2023 in [facility] Cardiac and Pulmonary Rehab  Education need identified 07/13/23    Biometrics:  Pre Biometrics - 07/13/23 1152       Pre Biometrics   Height 5' 11 (1.803 m)    Weight 228 lb 1.6 oz (103.5 kg)    Waist Circumference 42.5 inches    Hip Circumference 39 inches    Waist to Hip Ratio 1.09 %    BMI (Calculated) 31.83    Single Leg Stand 30 seconds           Nutrition Therapy Plan and Nutrition Goals:   Nutrition Assessments:  MEDIFICTS Score Key: >=70 Need to make dietary changes  40-70 Heart Healthy Diet <= 40 Therapeutic Level Cholesterol Diet  Flowsheet Row Cardiac Rehab from 07/13/2023 in [facility] Cardiac and Pulmonary Rehab  Picture Your Plate Total Score on Admission 68  Picture Your Plate Scores: <59 Unhealthy dietary pattern with much room for improvement. 41-50 Dietary pattern unlikely to meet recommendations for good health and room for improvement. 51-60 More healthful dietary pattern, with some room for improvement.  >60 Healthy dietary pattern, although there may be some specific behaviors that could be improved.    Nutrition Goals Re-Evaluation:   Nutrition Goals Discharge (Final Nutrition Goals Re-Evaluation):   Psychosocial: Target Goals: Acknowledge presence or absence of significant depression and/or stress, maximize coping skills, provide positive support system. Participant is able to verbalize types and ability to use techniques and skills needed for reducing stress and depression.   Education: Stress, Anxiety, and Depression - Group verbal and visual presentation to define topics covered.  Reviews how body is impacted by stress, anxiety, and depression.  Also discusses healthy ways to reduce stress and to treat/manage anxiety and depression.   Written material given at graduation.   Education: Sleep Hygiene -Provides group verbal and written instruction about how sleep can affect your health.  Define sleep hygiene, discuss sleep cycles and impact of sleep habits. Review good sleep hygiene tips.    Initial Review & Psychosocial Screening:  Initial Psych Review & Screening - 07/08/23 1441       Initial Review   Current issues with Current Psychotropic Meds;Current Anxiety/Panic;Current Sleep Concerns      Family Dynamics   Good Support System? Yes    Comments Daniel Crosby has some anxiety when he is in crowed areas when he is out. He was sleeping alot for awhile and now he is sleeping ok. He takes medication for his sleep and has been helping for the most part.      Barriers   Psychosocial barriers to participate in program The patient should benefit from training in stress management and relaxation.;There are no identifiable barriers or psychosocial needs.      Screening Interventions   Interventions Provide feedback about the scores to participant;To provide support and resources with identified psychosocial needs;Encouraged to exercise    Expected Outcomes Short Term goal: Utilizing psychosocial counselor, staff and physician to assist with identification of specific Stressors or current issues interfering with healing process. Setting desired goal for each stressor or current issue identified.;Long Term Goal: Stressors or current issues are controlled or eliminated.;Short Term goal: Identification and review with participant of any Quality of Life or Depression concerns found by scoring the questionnaire.;Long Term goal: The participant improves quality of Life and PHQ9 Scores as seen by post scores and/or verbalization of changes          Quality of Life Scores:   Quality of Life - 07/13/23 1153       Quality of Life   Select Quality of Life      Quality of Life Scores   Health/Function Pre 13.07 %    Socioeconomic Pre  12.88 %    Psych/Spiritual Pre 13.21 %    Family Pre 13.4 %    GLOBAL Pre 13.1 %         Scores of 19 and below usually indicate a poorer quality of life in these areas.  A difference of  2-3 points is a clinically meaningful difference.  A difference of 2-3 points in the total score of the Quality of Life Index has been associated with significant improvement in overall quality of life, self-image, physical symptoms, and general health in studies assessing change in quality of life.  PHQ-9: Review Flowsheet        No  data to display         Interpretation of Total Score  Total Score Depression Severity:  1-4 = Minimal depression, 5-9 = Mild depression, 10-14 = Moderate depression, 15-19 = Moderately severe depression, 20-27 = Severe depression   Psychosocial Evaluation and Intervention:  Psychosocial Evaluation - 07/08/23 1444       Psychosocial Evaluation & Interventions   Interventions Encouraged to exercise with the program and follow exercise prescription;Relaxation education;Stress management education    Comments Daniel Crosby has some anxiety when he is in crowed areas when he is out. He was sleeping alot for awhile and now he is sleeping ok. He takes medication for his sleep and has been helping for the most part.    Expected Outcomes Short: Start HeartTrack to help with mood. Long: Maintain a healthy mental state    Continue Psychosocial Services  Follow up required by staff          Psychosocial Re-Evaluation:   Psychosocial Discharge (Final Psychosocial Re-Evaluation):   Vocational Rehabilitation: Provide vocational rehab assistance to qualifying candidates.   Vocational Rehab Evaluation & Intervention:   Education: Education Goals: Education classes will be provided on a variety of topics geared toward better understanding of heart health and risk factor modification. Participant will state understanding/return demonstration of topics presented as noted by  education test scores.  Learning Barriers/Preferences:  Learning Barriers/Preferences - 07/08/23 1438       Learning Barriers/Preferences   Learning Barriers None    Learning Preferences None          General Cardiac Education Topics:  AED/CPR: - Group verbal and written instruction with the use of models to demonstrate the basic use of the AED with the basic ABC's of resuscitation.   Anatomy and Cardiac Procedures: - Group verbal and visual presentation and models provide information about basic cardiac anatomy and function. Reviews the testing methods done to diagnose heart disease and the outcomes of the test results. Describes the treatment choices: Medical Management, Angioplasty, or Coronary Bypass Surgery for treating various heart conditions including Myocardial Infarction, Angina, Valve Disease, and Cardiac Arrhythmias.  Written material given at graduation. Flowsheet Row Cardiac Rehab from 07/13/2023 in [facility] Cardiac and Pulmonary Rehab  Education need identified 07/13/23    Medication Safety: - Group verbal and visual instruction to review commonly prescribed medications for heart and lung disease. Reviews the medication, class of the drug, and side effects. Includes the steps to properly store meds and maintain the prescription regimen.  Written material given at graduation.   Intimacy: - Group verbal instruction through game format to discuss how heart and lung disease can affect sexual intimacy. Written material given at graduation..   Know Your Numbers and Heart Failure: - Group verbal and visual instruction to discuss disease risk factors for cardiac and pulmonary disease and treatment options.  Reviews associated critical values for Overweight/Obesity, Hypertension, Cholesterol, and Diabetes.  Discusses basics of heart failure: signs/symptoms and treatments.  Introduces Heart Failure Zone chart for action plan for heart failure.  Written material given at  graduation.   Infection Prevention: - Provides verbal and written material to individual with discussion of infection control including proper hand washing and proper equipment cleaning during exercise session. Flowsheet Row Cardiac Rehab from 07/13/2023 in [facility] Cardiac and Pulmonary Rehab  Date 07/13/23  Educator [facility]  Instruction Review Code 1- Verbalizes Understanding    Falls Prevention: - Provides verbal and written material to individual with discussion of falls prevention and safety. Flowsheet  Row Cardiac Rehab from 07/13/2023 in [facility] Cardiac and Pulmonary Rehab  Date 07/13/23  Educator [facility]  Instruction Review Code 1- Verbalizes Understanding    Other: -Provides group and verbal instruction on various topics (see comments)   Knowledge Questionnaire Score:  Knowledge Questionnaire Score - 07/13/23 1154       Knowledge Questionnaire Score   Pre Score 23/26          Core Components/Risk Factors/Patient Goals at Admission:  Personal Goals and Risk Factors at Admission - 07/08/23 1437       Core Components/Risk Factors/Patient Goals on Admission    Weight Management Yes;Weight Loss    Intervention Weight Management: Develop a combined nutrition and exercise program designed to reach desired caloric intake, while maintaining appropriate intake of nutrient and fiber, sodium and fats, and appropriate energy expenditure required for the weight goal.;Weight Management: Provide education and appropriate resources to help participant work on and attain dietary goals.;Obesity: Provide education and appropriate resources to help participant work on and attain dietary goals.;Weight Management/Obesity: Establish reasonable short term and long term weight goals.    Expected Outcomes Short Term: Continue to assess and modify interventions until short term weight is achieved;Weight Loss: Understanding of general recommendations for a balanced deficit meal plan, which promotes 1-2 lb weight  loss per week and includes a negative energy balance of [phone] kcal/d;Understanding recommendations for meals to include 15-35% energy as protein, 25-35% energy from fat, 35-60% energy from carbohydrates, less than 200mg  of dietary cholesterol, 20-35 gm of total fiber daily;Understanding of distribution of calorie intake throughout the day with the consumption of 4-5 meals/snacks    Tobacco Cessation Yes    Number of packs per day 0    Intervention Assist the participant in steps to quit. Provide individualized education and counseling about committing to Tobacco Cessation, relapse prevention, and pharmacological support that can be provided by physician.;Education officer, environmental, assist with locating and accessing local/national Quit Smoking programs, and support quit date choice.    Expected Outcomes Short Term: Will demonstrate readiness to quit, by selecting a quit date.;Long Term: Complete abstinence from all tobacco products for at least 12 months from quit date.;Short Term: Will quit all tobacco product use, adhering to prevention of relapse plan.    Diabetes Yes    Intervention Provide education about signs/symptoms and action to take for hypo/hyperglycemia.;Provide education about proper nutrition, including hydration, and aerobic/resistive exercise prescription along with prescribed medications to achieve blood glucose in normal ranges: Fasting glucose 65-99 mg/dL    Expected Outcomes Long Term: Attainment of HbA1C < 7%.;Short Term: Participant verbalizes understanding of the signs/symptoms and immediate care of hyper/hypoglycemia, proper foot care and importance of medication, aerobic/resistive exercise and nutrition plan for blood glucose control.    Hypertension Yes    Intervention Provide education on lifestyle modifcations including regular physical activity/exercise, weight management, moderate sodium restriction and increased consumption of fresh fruit, vegetables, and low fat dairy,  alcohol moderation, and smoking cessation.;Monitor prescription use compliance.    Expected Outcomes Short Term: Continued assessment and intervention until BP is < 140/66mm HG in hypertensive participants. < 130/80mm HG in hypertensive participants with diabetes, heart failure or chronic kidney disease.;Long Term: Maintenance of blood pressure at goal levels.          Education:Diabetes - Individual verbal and written instruction to review signs/symptoms of diabetes, desired ranges of glucose level fasting, after meals and with exercise. Acknowledge that pre and post exercise glucose checks will be done for 3  sessions at entry of program. Flowsheet Row Cardiac Rehab from 07/13/2023 in [facility] Cardiac and Pulmonary Rehab  Date 07/13/23  Educator [facility]  Instruction Review Code 1- Verbalizes Understanding    Core Components/Risk Factors/Patient Goals Review:    Core Components/Risk Factors/Patient Goals at Discharge (Final Review):    ITP Comments:  ITP Comments     Row Name 07/08/23 1440 07/13/23 1145         ITP Comments Virtual Visit completed. Patient informed on EP and RD appointment and 6 Minute walk test. Patient also informed of patient health questionnaires on My Chart. Patient Verbalizes understanding. Visit diagnosis can be found in [facility] 06/01/2023. Completed and gym orientation for cardiac rehab. Initial ITP created and sent for review to Dr. Oneil Pinal, Medical Director.         Comments: Initial ITP

## 2023-07-13 NOTE — Progress Notes (Signed)
 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 Reviewed his food recall, spoke with him about his high sugar intake and low nutrient intake. Educated on quality and quality of carbohydrates. Provided handout on mediterranean diet. Educated on types of fats, sources, and how to read facts labels. Brainstormed several meal and snack ideas with foods he likes, focusing on building more balanced plates with more nutrient rich foods cutting back on added sugar.     Goal 1: Eat 15-30gProtein and 30-60gCarbs at each meal. Goal 2: Read labels and reduce sodium intake to below 2300mg . Ideally 1500mg  per day.  Goal 3: Reduce saturated fat, less than 12g per day. Replace bad fats for more heart healthy fats.   End time 11:42 AM

## 2023-07-13 NOTE — Patient Instructions (Signed)
 Patient Instructions  Patient Details  Name: Daniel Crosby MRN: [id number] Date of Birth: Nov 30, 1960 Referring Provider:  Charlestine Georgine DEL, MD  Below are your personal goals for exercise, nutrition, and risk factors. Our goal is to help you stay on track towards obtaining and maintaining these goals. We will be discussing your progress on these goals with you throughout the program.  Initial Exercise Prescription:  Initial Exercise Prescription - 07/13/23 1100       Date of Initial Exercise RX and Referring Provider   Date 07/13/23    Referring Provider Dr. Georgine Alemu      Oxygen   Maintain Oxygen Saturation 88% or higher      Treadmill   MPH 3    Grade 2    Minutes 15    METs 4.12      Elliptical   Level 1    Speed 3    Minutes 15    METs 3.96      Rower   Level 3    Watts 25    Minutes 15    METs 3.96      Prescription Details   Duration Progress to 30 minutes of continuous aerobic without signs/symptoms of physical distress      Intensity   THRR 40-80% of Max Heartrate 124-147    Ratings of Perceived Exertion 11-13    Perceived Dyspnea 0-4      Progression   Progression Continue to progress workloads to maintain intensity without signs/symptoms of physical distress.      Resistance Training   Training Prescription Yes    Weight 5lb    Reps 10-15          Exercise Goals: Frequency: Be able to perform aerobic exercise two to three times per week in program working toward 2-5 days per week of home exercise.  Intensity: Work with a perceived exertion of 11 (fairly light) - 15 (hard) while following your exercise prescription.  We will make changes to your prescription with you as you progress through the program.   Duration: Be able to do 30 to 45 minutes of continuous aerobic exercise in addition to a 5 minute warm-up and a 5 minute cool-down routine.   Nutrition Goals: Your personal nutrition goals will be established when you do your nutrition analysis  with the dietician.  The following are general nutrition guidelines to follow: Cholesterol < 200mg /day Sodium < 1500mg /day Fiber: Men over 50 yrs - 30 grams per day  Personal Goals:  Personal Goals and Risk Factors at Admission - 07/08/23 1437       Core Components/Risk Factors/Patient Goals on Admission    Weight Management Yes;Weight Loss    Intervention Weight Management: Develop a combined nutrition and exercise program designed to reach desired caloric intake, while maintaining appropriate intake of nutrient and fiber, sodium and fats, and appropriate energy expenditure required for the weight goal.;Weight Management: Provide education and appropriate resources to help participant work on and attain dietary goals.;Obesity: Provide education and appropriate resources to help participant work on and attain dietary goals.;Weight Management/Obesity: Establish reasonable short term and long term weight goals.    Expected Outcomes Short Term: Continue to assess and modify interventions until short term weight is achieved;Weight Loss: Understanding of general recommendations for a balanced deficit meal plan, which promotes 1-2 lb weight loss per week and includes a negative energy balance of [phone] kcal/d;Understanding recommendations for meals to include 15-35% energy as protein, 25-35% energy from fat, 35-60% energy from  carbohydrates, less than 200mg  of dietary cholesterol, 20-35 gm of total fiber daily;Understanding of distribution of calorie intake throughout the day with the consumption of 4-5 meals/snacks    Tobacco Cessation Yes    Number of packs per day 0    Intervention Assist the participant in steps to quit. Provide individualized education and counseling about committing to Tobacco Cessation, relapse prevention, and pharmacological support that can be provided by physician.;Education officer, environmental, assist with locating and accessing local/national Quit Smoking programs, and  support quit date choice.    Expected Outcomes Short Term: Will demonstrate readiness to quit, by selecting a quit date.;Long Term: Complete abstinence from all tobacco products for at least 12 months from quit date.;Short Term: Will quit all tobacco product use, adhering to prevention of relapse plan.    Diabetes Yes    Intervention Provide education about signs/symptoms and action to take for hypo/hyperglycemia.;Provide education about proper nutrition, including hydration, and aerobic/resistive exercise prescription along with prescribed medications to achieve blood glucose in normal ranges: Fasting glucose 65-99 mg/dL    Expected Outcomes Long Term: Attainment of HbA1C < 7%.;Short Term: Participant verbalizes understanding of the signs/symptoms and immediate care of hyper/hypoglycemia, proper foot care and importance of medication, aerobic/resistive exercise and nutrition plan for blood glucose control.    Hypertension Yes    Intervention Provide education on lifestyle modifcations including regular physical activity/exercise, weight management, moderate sodium restriction and increased consumption of fresh fruit, vegetables, and low fat dairy, alcohol moderation, and smoking cessation.;Monitor prescription use compliance.    Expected Outcomes Short Term: Continued assessment and intervention until BP is < 140/61mm HG in hypertensive participants. < 130/6mm HG in hypertensive participants with diabetes, heart failure or chronic kidney disease.;Long Term: Maintenance of blood pressure at goal levels.          Tobacco Use Initial Evaluation: Social History   Tobacco Use  Smoking Status Never  Smokeless Tobacco Current   Types: Snuff    Exercise Goals and Review:  Exercise Goals     Row Name 07/13/23 1151             Exercise Goals   Increase Physical Activity Yes       Intervention Provide advice, education, support and counseling about physical activity/exercise needs.;Develop an  individualized exercise prescription for aerobic and resistive training based on initial evaluation findings, risk stratification, comorbidities and participant's personal goals.       Expected Outcomes Short Term: Attend rehab on a regular basis to increase amount of physical activity.;Long Term: Add in home exercise to make exercise part of routine and to increase amount of physical activity.;Long Term: Exercising regularly at least 3-5 days a week.       Increase Strength and Stamina Yes       Intervention Develop an individualized exercise prescription for aerobic and resistive training based on initial evaluation findings, risk stratification, comorbidities and participant's personal goals.;Provide advice, education, support and counseling about physical activity/exercise needs.       Expected Outcomes Short Term: Increase workloads from initial exercise prescription for resistance, speed, and METs.;Short Term: Perform resistance training exercises routinely during rehab and add in resistance training at home;Long Term: Improve cardiorespiratory fitness, muscular endurance and strength as measured by increased METs and functional capacity ( )       Able to understand and use rate of perceived exertion (RPE) scale Yes       Intervention Provide education and explanation on how to use RPE scale  Expected Outcomes Short Term: Able to use RPE daily in rehab to express subjective intensity level;Long Term:  Able to use RPE to guide intensity level when exercising independently       Able to understand and use Dyspnea scale Yes       Intervention Provide education and explanation on how to use Dyspnea scale       Expected Outcomes Short Term: Able to use Dyspnea scale daily in rehab to express subjective sense of shortness of breath during exertion;Long Term: Able to use Dyspnea scale to guide intensity level when exercising independently       Knowledge and understanding of Target Heart Rate Range  (THRR) Yes       Intervention Provide education and explanation of THRR including how the numbers were predicted and where they are located for reference       Expected Outcomes Short Term: Able to state/look up THRR;Long Term: Able to use THRR to govern intensity when exercising independently;Short Term: Able to use daily as guideline for intensity in rehab       Able to check pulse independently Yes       Intervention Provide education and demonstration on how to check pulse in carotid and radial arteries.;Review the importance of being able to check your own pulse for safety during independent exercise       Expected Outcomes Short Term: Able to explain why pulse checking is important during independent exercise;Long Term: Able to check pulse independently and accurately       Understanding of Exercise Prescription Yes       Intervention Provide education, explanation, and written materials on patient's individual exercise prescription       Expected Outcomes Short Term: Able to explain program exercise prescription;Long Term: Able to explain home exercise prescription to exercise independently          Copy of goals given to participant.

## 2023-07-17 ENCOUNTER — Encounter

## 2023-07-17 DIAGNOSIS — Z48812 Encounter for surgical aftercare following surgery on the circulatory system: Secondary | ICD-10-CM | POA: Diagnosis not present

## 2023-07-17 DIAGNOSIS — Z952 Presence of prosthetic heart valve: Secondary | ICD-10-CM

## 2023-07-17 LAB — GLUCOSE, CAPILLARY
Glucose-Capillary: 254 mg/dL — ABNORMAL HIGH (ref 70–99)
Glucose-Capillary: 197 mg/dL — ABNORMAL HIGH (ref 70–99)

## 2023-07-17 NOTE — Progress Notes (Signed)
 Daily Session Note  Patient Details  Name: Daniel Crosby MRN: [id number] Date of Birth: 1960/08/13 Referring Provider:   Flowsheet Row Cardiac Rehab from 07/13/2023 in [facility] Cardiac and Pulmonary Rehab  Referring Provider Dr. Georgine Mc    Encounter Date: 07/17/2023  Check In:  Session Check In - 07/17/23 1050       Check-In   Supervising physician immediately available to respond to emergencies See telemetry face sheet for immediately available ER MD    Location [facility]-Cardiac & Pulmonary Rehab    Staff Present Devaughn Jaeger, BS, Exercise Physiologist;Chace Klippel RN,BSN,MPA;Maxon Conetta BS, Exercise Physiologist;Joseph Hood RCP,RRT,BSRT    Virtual Visit No    Medication changes reported     No    Fall or balance concerns reported    No    Tobacco Cessation No Change    Warm-up and Cool-down Performed on first and last piece of equipment    Resistance Training Performed Yes    VAD Patient? No    PAD/SET Patient? No      Pain Assessment   Currently in Pain? No/denies             Social History   Tobacco Use  Smoking Status Never  Smokeless Tobacco Current   Types: Snuff    Goals Met:  Independence with exercise equipment Exercise tolerated well No report of concerns or symptoms today Strength training completed today  Goals Unmet:  Not Applicable  Comments: First full day of exercise!  Patient was oriented to gym and equipment including functions, settings, policies, and procedures.  Patient's individual exercise prescription and treatment plan were reviewed.  All starting workloads were established based on the results of the 6 minute walk test done at initial orientation visit.  The plan for exercise progression was also introduced and progression will be customized based on patient's performance and goals.     Dr. Oneil Pinal is Medical Director for [facility] Cardiac Rehabilitation.  Dr. Fuad Aleskerov is Medical Director for [facility] Pulmonary  Rehabilitation.

## 2023-07-20 ENCOUNTER — Encounter

## 2023-07-20 DIAGNOSIS — Z48812 Encounter for surgical aftercare following surgery on the circulatory system: Secondary | ICD-10-CM | POA: Diagnosis not present

## 2023-07-20 DIAGNOSIS — Z952 Presence of prosthetic heart valve: Secondary | ICD-10-CM

## 2023-07-20 DIAGNOSIS — Z95828 Presence of other vascular implants and grafts: Secondary | ICD-10-CM

## 2023-07-20 LAB — GLUCOSE, CAPILLARY
Glucose-Capillary: 199 mg/dL — ABNORMAL HIGH (ref 70–99)
Glucose-Capillary: 183 mg/dL — ABNORMAL HIGH (ref 70–99)

## 2023-07-20 NOTE — Progress Notes (Signed)
 Daily Session Note  Patient Details  Name: Daniel Crosby MRN: [id number] Date of Birth: Apr 13, 1960 Referring Provider:   Flowsheet Row Cardiac Rehab from 07/13/2023 in [facility] Cardiac and Pulmonary Rehab  Referring Provider Dr. Georgine Mc    Encounter Date: 07/20/2023  Check In:  Session Check In - 07/20/23 1100       Check-In   Supervising physician immediately available to respond to emergencies See telemetry face sheet for immediately available ER MD    Location [facility]-Cardiac & Pulmonary Rehab    Staff Present Hoy Rodney RN,BSN;Juanya Villavicencio RN,BSN,MPA;Maxon Conetta BS, Exercise Physiologist;Joseph Rolinda RCP,RRT,BSRT    Virtual Visit No    Medication changes reported     No    Fall or balance concerns reported    No    Tobacco Cessation No Change    Warm-up and Cool-down Performed on first and last piece of equipment    Resistance Training Performed Yes    VAD Patient? No    PAD/SET Patient? No      Pain Assessment   Currently in Pain? No/denies             Social History   Tobacco Use  Smoking Status Never  Smokeless Tobacco Current   Types: Snuff    Goals Met:  Independence with exercise equipment Exercise tolerated well No report of concerns or symptoms today Strength training completed today  Goals Unmet:  Not Applicable  Comments: Pt able to follow exercise prescription today without complaint.  Will continue to monitor for progression.    Dr. Oneil Pinal is Medical Director for [facility] Cardiac Rehabilitation.  Dr. Fuad Aleskerov is Medical Director for [facility] Pulmonary Rehabilitation.

## 2023-07-22 ENCOUNTER — Encounter: Attending: Internal Medicine

## 2023-07-22 DIAGNOSIS — Z952 Presence of prosthetic heart valve: Secondary | ICD-10-CM | POA: Diagnosis present

## 2023-07-22 DIAGNOSIS — Z48812 Encounter for surgical aftercare following surgery on the circulatory system: Secondary | ICD-10-CM | POA: Insufficient documentation

## 2023-07-22 DIAGNOSIS — Z95828 Presence of other vascular implants and grafts: Secondary | ICD-10-CM | POA: Diagnosis present

## 2023-07-22 LAB — GLUCOSE, CAPILLARY
Glucose-Capillary: 158 mg/dL — ABNORMAL HIGH (ref 70–99)
Glucose-Capillary: 148 mg/dL — ABNORMAL HIGH (ref 70–99)

## 2023-07-22 NOTE — Progress Notes (Signed)
 Cardiac Individual Treatment Plan  Patient Details  Name: Daniel Crosby MRN: [id number] Date of Birth: 25-Mar-1960 Referring Provider:   Flowsheet Row Cardiac Rehab from 07/13/2023 in [facility] Cardiac and Pulmonary Rehab  Referring Provider Dr. 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  Patient's Home Medications on Admission:  Current Outpatient Medications:    amLODipine (NORVASC) 10 MG tablet, Take 10 mg by mouth daily., Disp: , Rfl:    aspirin EC 81 MG tablet, Take 81 mg by mouth., Disp: , Rfl:    citalopram (CELEXA) 40 MG tablet, Take 40 mg by mouth., Disp: , Rfl:    DULoxetine (CYMBALTA) 60 MG capsule, Take 60 mg by mouth., Disp: , Rfl:    famotidine (PEPCID) 20 MG tablet, Take 20 mg by mouth 2 (two) times daily., Disp: , Rfl:    fluticasone (FLONASE) 50 MCG/ACT nasal spray, 2 sprays., Disp: , Rfl:    furosemide (LASIX) 40 MG tablet, Take 40 mg by mouth., Disp: , Rfl:    lisinopril (ZESTRIL) 40 MG tablet, Take 40 mg by mouth daily., Disp: , Rfl:    metFORMIN (GLUCOPHAGE) 500 MG tablet, TAKE 1 TABLET WITH         BREAKFAST, Disp: , Rfl:    naproxen  (NAPROSYN ) 500 MG tablet, Take 1 tablet (500 mg total) by mouth 2 (two) times daily with a meal., Disp: 14 tablet, Rfl: 0   oxyCODONE (OXY IR/ROXICODONE) 5 MG immediate release tablet, Take by mouth. (Patient not taking: Reported on 07/08/2023), Disp: , Rfl:    potassium chloride SA (KLOR-CON M) 20 MEQ tablet, Take 20 mEq by mouth., Disp: , Rfl:    pravastatin (PRAVACHOL) 40 MG tablet, SMARTSIG:1 Tablet(s) By Mouth Every Evening, Disp: , Rfl:    pravastatin (PRAVACHOL) 80 MG tablet, Take 80 mg by mouth., Disp: , Rfl:    Probiotic Product (PROBIOTIC DAILY) CAPS, Take 1 tablet by mouth every evening., Disp: , Rfl:    traZODone (DESYREL) 50 MG  tablet, Take 50 mg by mouth., Disp: , Rfl:    warfarin (COUMADIN) 10 MG tablet, Take 10 mg by mouth., Disp: , Rfl:   Past Medical History: No past medical history on file.  Tobacco Use: Social History   Tobacco Use  Smoking Status Never  Smokeless Tobacco Current   Types: Snuff    Labs: Review Flowsheet        No data to display           Exercise Target Goals: Exercise Program Goal: Individual exercise prescription set using results from initial 6 min walk test and THRR while considering  patient's activity barriers and safety.   Exercise Prescription Goal: Initial exercise prescription builds to 30-45 minutes a day of aerobic activity, 2-3 days per week.  Home exercise guidelines will be given to patient during program as part of exercise prescription that the participant will acknowledge.   Education: Aerobic Exercise: - Group verbal and visual presentation on the components of exercise prescription. Introduces F.I.T.T principle from ACSM for exercise prescriptions.  Reviews F.I.T.T. principles of aerobic exercise including progression. Written material given at graduation. Flowsheet Row Cardiac Rehab from 07/13/2023 in [facility] Cardiac and Pulmonary Rehab  Education need identified 07/13/23    Education: Resistance Exercise: - Group verbal and visual presentation on the components of exercise prescription.  Introduces F.I.T.T principle from ACSM for exercise prescriptions  Reviews F.I.T.T. principles of resistance exercise including progression. Written material given at graduation.    Education: Exercise & Equipment Safety: - Individual verbal instruction and demonstration of equipment use and safety with use of the equipment. Flowsheet Row Cardiac Rehab from 07/13/2023 in [facility] Cardiac and Pulmonary Rehab  Date 07/13/23  Educator [facility]  Instruction Review Code 1- Verbalizes Understanding    Education: Exercise Physiology & General Exercise Guidelines: - Group verbal  and written instruction with models to review the exercise physiology of the cardiovascular system and associated critical values. Provides general exercise guidelines with specific guidelines to those with heart or lung disease.    Education: Flexibility, Balance, Mind/Body Relaxation: - Group verbal and visual presentation with interactive activity on the components of exercise prescription. Introduces F.I.T.T principle from ACSM for exercise prescriptions. Reviews F.I.T.T. principles of flexibility and balance exercise training including progression. Also discusses the mind body connection.  Reviews various relaxation techniques to help reduce and manage stress (i.e. Deep breathing, progressive muscle relaxation, and visualization). Balance handout provided to take home. Written material given at graduation.   Activity Barriers & Risk Stratification:  Activity Barriers & Cardiac Risk Stratification - 07/13/23 1148       Activity Barriers & Cardiac Risk Stratification   Activity Barriers Joint Problems    Cardiac Risk Stratification Low          6 Minute Walk:  6 Minute Walk     Row Name 07/13/23 1145         6 Minute Walk   Phase Initial     Distance 1555 feet     Walk Time 6 minutes     # of Rest Breaks 0     MPH 2.9     METS 3.96     RPE 9     Perceived Dyspnea  0     VO2 Peak 13.87     Symptoms No     Resting HR 102 bpm     Resting BP 128/72     Resting Oxygen Saturation  95 %     Exercise Oxygen Saturation  during 6 min walk 97 %     Max Ex. HR 114 bpm     Max Ex. BP 156/74     2 Minute Post BP 134/72        Oxygen Initial Assessment:   Oxygen Re-Evaluation:   Oxygen Discharge (Final Oxygen Re-Evaluation):   Initial Exercise Prescription:  Initial Exercise Prescription - 07/13/23 1100       Date of Initial Exercise RX and Referring Provider   Date 07/13/23    Referring Provider Dr. Georgine Alemu      Oxygen   Maintain Oxygen Saturation 88% or  higher      Treadmill   MPH 3    Grade 2    Minutes 15    METs 4.12      Elliptical   Level 1    Speed 3    Minutes 15    METs 3.96      Rower   Level 3    Watts 25    Minutes 15    METs 3.96      Prescription Details   Duration Progress to 30 minutes of continuous aerobic without signs/symptoms of physical distress      Intensity   THRR 40-80% of Max Heartrate 124-147    Ratings of Perceived Exertion 11-13  Perceived Dyspnea 0-4      Progression   Progression Continue to progress workloads to maintain intensity without signs/symptoms of physical distress.      Resistance Training   Training Prescription Yes    Weight 5lb    Reps 10-15          Perform Capillary Blood Glucose checks as needed.  Exercise Prescription Changes:   Exercise Prescription Changes     Row Name 07/13/23 1100             Response to Exercise   Blood Pressure (Admit) 128/72       Blood Pressure (Exercise) 156/74       Blood Pressure (Exit) 134/72       Heart Rate (Admit) 102 bpm       Heart Rate (Exercise) 114 bpm       Heart Rate (Exit) 97 bpm       Oxygen Saturation (Admit) 95 %       Oxygen Saturation (Exercise) 97 %       Oxygen Saturation (Exit) 97 %       Rating of Perceived Exertion (Exercise) 9       Perceived Dyspnea (Exercise) 0       Symptoms none       Comments results          Exercise Comments:   Exercise Comments     Row Name 07/17/23 1051           Exercise Comments First full day of exercise!  Patient was oriented to gym and equipment including functions, settings, policies, and procedures.  Patient's individual exercise prescription and treatment plan were reviewed.  All starting workloads were established based on the results of the 6 minute walk test done at initial orientation visit.  The plan for exercise progression was also introduced and progression will be customized based on patient's performance and goals.          Exercise  Goals and Review:   Exercise Goals     Row Name 07/13/23 1151             Exercise Goals   Increase Physical Activity Yes       Intervention Provide advice, education, support and counseling about physical activity/exercise needs.;Develop an individualized exercise prescription for aerobic and resistive training based on initial evaluation findings, risk stratification, comorbidities and participant's personal goals.       Expected Outcomes Short Term: Attend rehab on a regular basis to increase amount of physical activity.;Long Term: Add in home exercise to make exercise part of routine and to increase amount of physical activity.;Long Term: Exercising regularly at least 3-5 days a week.       Increase Strength and Stamina Yes       Intervention Develop an individualized exercise prescription for aerobic and resistive training based on initial evaluation findings, risk stratification, comorbidities and participant's personal goals.;Provide advice, education, support and counseling about physical activity/exercise needs.       Expected Outcomes Short Term: Increase workloads from initial exercise prescription for resistance, speed, and METs.;Short Term: Perform resistance training exercises routinely during rehab and add in resistance training at home;Long Term: Improve cardiorespiratory fitness, muscular endurance and strength as measured by increased METs and functional capacity ( )       Able to understand and use rate of perceived exertion (RPE) scale Yes       Intervention Provide education and explanation on how to use RPE scale  Expected Outcomes Short Term: Able to use RPE daily in rehab to express subjective intensity level;Long Term:  Able to use RPE to guide intensity level when exercising independently       Able to understand and use Dyspnea scale Yes       Intervention Provide education and explanation on how to use Dyspnea scale       Expected Outcomes Short Term: Able to  use Dyspnea scale daily in rehab to express subjective sense of shortness of breath during exertion;Long Term: Able to use Dyspnea scale to guide intensity level when exercising independently       Knowledge and understanding of Target Heart Rate Range (THRR) Yes       Intervention Provide education and explanation of THRR including how the numbers were predicted and where they are located for reference       Expected Outcomes Short Term: Able to state/look up THRR;Long Term: Able to use THRR to govern intensity when exercising independently;Short Term: Able to use daily as guideline for intensity in rehab       Able to check pulse independently Yes       Intervention Provide education and demonstration on how to check pulse in carotid and radial arteries.;Review the importance of being able to check your own pulse for safety during independent exercise       Expected Outcomes Short Term: Able to explain why pulse checking is important during independent exercise;Long Term: Able to check pulse independently and accurately       Understanding of Exercise Prescription Yes       Intervention Provide education, explanation, and written materials on patient's individual exercise prescription       Expected Outcomes Short Term: Able to explain program exercise prescription;Long Term: Able to explain home exercise prescription to exercise independently          Exercise Goals Re-Evaluation :  Exercise Goals Re-Evaluation     Row Name 07/17/23 1052             Exercise Goal Re-Evaluation   Exercise Goals Review Increase Physical Activity;Able to understand and use rate of perceived exertion (RPE) scale;Knowledge and understanding of Target Heart Rate Range (THRR);Understanding of Exercise Prescription;Increase Strength and Stamina;Able to understand and use Dyspnea scale;Able to check pulse independently       Comments Reviewed RPE and dyspnea scale, THR and program prescription with pt today.  Pt  voiced understanding and was given a copy of goals to take home.       Expected Outcomes Short: Use RPE daily to regulate intensity. Long: Follow program prescription in THR.          Discharge Exercise Prescription (Final Exercise Prescription Changes):  Exercise Prescription Changes - 07/13/23 1100       Response to Exercise   Blood Pressure (Admit) 128/72    Blood Pressure (Exercise) 156/74    Blood Pressure (Exit) 134/72    Heart Rate (Admit) 102 bpm    Heart Rate (Exercise) 114 bpm    Heart Rate (Exit) 97 bpm    Oxygen Saturation (Admit) 95 %    Oxygen Saturation (Exercise) 97 %    Oxygen Saturation (Exit) 97 %    Rating of Perceived Exertion (Exercise) 9    Perceived Dyspnea (Exercise) 0    Symptoms none    Comments results          Nutrition:  Target Goals: Understanding of nutrition guidelines, daily intake of sodium 1500mg , cholesterol <  200mg , calories 30% from fat and 7% or less from saturated fats, daily to have 5 or more servings of fruits and vegetables.  Education: All About Nutrition: -Group instruction provided by verbal, written material, interactive activities, discussions, models, and posters to present general guidelines for heart healthy nutrition including fat, fiber, MyPlate, the role of sodium in heart healthy nutrition, utilization of the nutrition label, and utilization of this knowledge for meal planning. Follow up email sent as well. Written material given at graduation. Flowsheet Row Cardiac Rehab from 07/13/2023 in [facility] Cardiac and Pulmonary Rehab  Education need identified 07/13/23    Biometrics:  Pre Biometrics - 07/13/23 1152       Pre Biometrics   Height 5' 11 (1.803 m)    Weight 228 lb 1.6 oz (103.5 kg)    Waist Circumference 42.5 inches    Hip Circumference 39 inches    Waist to Hip Ratio 1.09 %    BMI (Calculated) 31.83    Single Leg Stand 30 seconds           Nutrition Therapy Plan and Nutrition Goals:  Nutrition  Therapy & Goals - 07/13/23 1336       Nutrition Therapy   Diet Cardiac, Low Na    Protein (specify units) 90    Fiber 30 grams    Whole Grain Foods 3 servings    Saturated Fats 15 max. grams    Fruits and Vegetables 5 servings/day    Sodium 2 grams      Personal Nutrition Goals   Nutrition Goal Eat 15-30gProtein and 30-60gCarbs at each meal.    Personal Goal #2 Read labels and reduce sodium intake to below 2300mg . Ideally 1500mg  per day.    Personal Goal #3 Reduce saturated fat, less than 12g per day. Replace bad fats for more heart healthy fats.    Comments Patient drinking fruit juice and almond milk mostly. Reviewed his food recall, spoke with him about his high sugar intake and low nutrient intake. Educated on quality and quality of carbohydrates. Provided handout on mediterranean diet. Educated on types of fats, sources, and how to read facts labels. Brainstormed several meal and snack ideas with foods he likes, focusing on building more balanced plates with more nutrient rich foods cutting back on added sugar.      Intervention Plan   Intervention Prescribe, educate and counsel regarding individualized specific dietary modifications aiming towards targeted core components such as weight, hypertension, lipid management, diabetes, heart failure and other comorbidities.;Nutrition handout(s) given to patient.    Expected Outcomes Short Term Goal: Understand basic principles of dietary content, such as calories, fat, sodium, cholesterol and nutrients.;Short Term Goal: A plan has been developed with personal nutrition goals set during dietitian appointment.;Long Term Goal: Adherence to prescribed nutrition plan.          Nutrition Assessments:  MEDIFICTS Score Key: >=70 Need to make dietary changes  40-70 Heart Healthy Diet <= 40 Therapeutic Level Cholesterol Diet  Flowsheet Row Cardiac Rehab from 07/13/2023 in [facility] Cardiac and Pulmonary Rehab  Picture Your Plate Total Score on  Admission 68   Picture Your Plate Scores: <59 Unhealthy dietary pattern with much room for improvement. 41-50 Dietary pattern unlikely to meet recommendations for good health and room for improvement. 51-60 More healthful dietary pattern, with some room for improvement.  >60 Healthy dietary pattern, although there may be some specific behaviors that could be improved.    Nutrition Goals Re-Evaluation:   Nutrition Goals Discharge (Final  Nutrition Goals Re-Evaluation):   Psychosocial: Target Goals: Acknowledge presence or absence of significant depression and/or stress, maximize coping skills, provide positive support system. Participant is able to verbalize types and ability to use techniques and skills needed for reducing stress and depression.   Education: Stress, Anxiety, and Depression - Group verbal and visual presentation to define topics covered.  Reviews how body is impacted by stress, anxiety, and depression.  Also discusses healthy ways to reduce stress and to treat/manage anxiety and depression.  Written material given at graduation.   Education: Sleep Hygiene -Provides group verbal and written instruction about how sleep can affect your health.  Define sleep hygiene, discuss sleep cycles and impact of sleep habits. Review good sleep hygiene tips.    Initial Review & Psychosocial Screening:  Initial Psych Review & Screening - 07/08/23 1441       Initial Review   Current issues with Current Psychotropic Meds;Current Anxiety/Panic;Current Sleep Concerns      Family Dynamics   Good Support System? Yes    Comments Jadien has some anxiety when he is in crowed areas when he is out. He was sleeping alot for awhile and now he is sleeping ok. He takes medication for his sleep and has been helping for the most part.      Barriers   Psychosocial barriers to participate in program The patient should benefit from training in stress management and relaxation.;There are no  identifiable barriers or psychosocial needs.      Screening Interventions   Interventions Provide feedback about the scores to participant;To provide support and resources with identified psychosocial needs;Encouraged to exercise    Expected Outcomes Short Term goal: Utilizing psychosocial counselor, staff and physician to assist with identification of specific Stressors or current issues interfering with healing process. Setting desired goal for each stressor or current issue identified.;Long Term Goal: Stressors or current issues are controlled or eliminated.;Short Term goal: Identification and review with participant of any Quality of Life or Depression concerns found by scoring the questionnaire.;Long Term goal: The participant improves quality of Life and PHQ9 Scores as seen by post scores and/or verbalization of changes          Quality of Life Scores:   Quality of Life - 07/13/23 1153       Quality of Life   Select Quality of Life      Quality of Life Scores   Health/Function Pre 13.07 %    Socioeconomic Pre 12.88 %    Psych/Spiritual Pre 13.21 %    Family Pre 13.4 %    GLOBAL Pre 13.1 %         Scores of 19 and below usually indicate a poorer quality of life in these areas.  A difference of  2-3 points is a clinically meaningful difference.  A difference of 2-3 points in the total score of the Quality of Life Index has been associated with significant improvement in overall quality of life, self-image, physical symptoms, and general health in studies assessing change in quality of life.  PHQ-9: Review Flowsheet       07/13/2023  Depression screen PHQ 2/9  Decreased Interest 0  Down, Depressed, Hopeless 0  PHQ - 2 Score 0  Altered sleeping 3  Tired, decreased energy 0  Change in appetite 0  Feeling bad or failure about yourself  0  Trouble concentrating 0  Moving slowly or fidgety/restless 0  Suicidal thoughts 0  PHQ-9 Score 3  Difficult doing work/chores Not  difficult at all   Interpretation of Total Score  Total Score Depression Severity:  1-4 = Minimal depression, 5-9 = Mild depression, 10-14 = Moderate depression, 15-19 = Moderately severe depression, 20-27 = Severe depression   Psychosocial Evaluation and Intervention:  Psychosocial Evaluation - 07/08/23 1444       Psychosocial Evaluation & Interventions   Interventions Encouraged to exercise with the program and follow exercise prescription;Relaxation education;Stress management education    Comments Vern has some anxiety when he is in crowed areas when he is out. He was sleeping alot for awhile and now he is sleeping ok. He takes medication for his sleep and has been helping for the most part.    Expected Outcomes Short: Start [facility] to help with mood. Long: Maintain a healthy mental state    Continue Psychosocial Services  Follow up required by staff          Psychosocial Re-Evaluation:   Psychosocial Discharge (Final Psychosocial Re-Evaluation):   Vocational Rehabilitation: Provide vocational rehab assistance to qualifying candidates.   Vocational Rehab Evaluation & Intervention:   Education: Education Goals: Education classes will be provided on a variety of topics geared toward better understanding of heart health and risk factor modification. Participant will state understanding/return demonstration of topics presented as noted by education test scores.  Learning Barriers/Preferences:  Learning Barriers/Preferences - 07/08/23 1438       Learning Barriers/Preferences   Learning Barriers None    Learning Preferences None          General Cardiac Education Topics:  AED/CPR: - Group verbal and written instruction with the use of models to demonstrate the basic use of the AED with the basic ABC's of resuscitation.   Anatomy and Cardiac Procedures: - Group verbal and visual presentation and models provide information about basic cardiac anatomy and  function. Reviews the testing methods done to diagnose heart disease and the outcomes of the test results. Describes the treatment choices: Medical Management, Angioplasty, or Coronary Bypass Surgery for treating various heart conditions including Myocardial Infarction, Angina, Valve Disease, and Cardiac Arrhythmias.  Written material given at graduation. Flowsheet Row Cardiac Rehab from 07/13/2023 in [facility] Cardiac and Pulmonary Rehab  Education need identified 07/13/23    Medication Safety: - Group verbal and visual instruction to review commonly prescribed medications for heart and lung disease. Reviews the medication, class of the drug, and side effects. Includes the steps to properly store meds and maintain the prescription regimen.  Written material given at graduation.   Intimacy: - Group verbal instruction through game format to discuss how heart and lung disease can affect sexual intimacy. Written material given at graduation..   Know Your Numbers and Heart Failure: - Group verbal and visual instruction to discuss disease risk factors for cardiac and pulmonary disease and treatment options.  Reviews associated critical values for Overweight/Obesity, Hypertension, Cholesterol, and Diabetes.  Discusses basics of heart failure: signs/symptoms and treatments.  Introduces Heart Failure Zone chart for action plan for heart failure.  Written material given at graduation.   Infection Prevention: - Provides verbal and written material to individual with discussion of infection control including proper hand washing and proper equipment cleaning during exercise session. Flowsheet Row Cardiac Rehab from 07/13/2023 in [facility] Cardiac and Pulmonary Rehab  Date 07/13/23  Educator [facility]  Instruction Review Code 1- Verbalizes Understanding    Falls Prevention: - Provides verbal and written material to individual with discussion of falls prevention and safety. Flowsheet Row Cardiac Rehab from 07/13/2023 in  [facility] Cardiac and Pulmonary Rehab  Date 07/13/23  Educator [facility]  Instruction Review Code 1- Verbalizes Understanding    Other: -Provides group and verbal instruction on various topics (see comments)   Knowledge Questionnaire Score:  Knowledge Questionnaire Score - 07/13/23 1154       Knowledge Questionnaire Score   Pre Score 23/26          Core Components/Risk Factors/Patient Goals at Admission:  Personal Goals and Risk Factors at Admission - 07/08/23 1437       Core Components/Risk Factors/Patient Goals on Admission    Weight Management Yes;Weight Loss    Intervention Weight Management: Develop a combined nutrition and exercise program designed to reach desired caloric intake, while maintaining appropriate intake of nutrient and fiber, sodium and fats, and appropriate energy expenditure required for the weight goal.;Weight Management: Provide education and appropriate resources to help participant work on and attain dietary goals.;Obesity: Provide education and appropriate resources to help participant work on and attain dietary goals.;Weight Management/Obesity: Establish reasonable short term and long term weight goals.    Expected Outcomes Short Term: Continue to assess and modify interventions until short term weight is achieved;Weight Loss: Understanding of general recommendations for a balanced deficit meal plan, which promotes 1-2 lb weight loss per week and includes a negative energy balance of [phone] kcal/d;Understanding recommendations for meals to include 15-35% energy as protein, 25-35% energy from fat, 35-60% energy from carbohydrates, less than 200mg  of dietary cholesterol, 20-35 gm of total fiber daily;Understanding of distribution of calorie intake throughout the day with the consumption of 4-5 meals/snacks    Tobacco Cessation Yes    Number of packs per day 0    Intervention Assist the participant in steps to quit. Provide individualized education and counseling about  committing to Tobacco Cessation, relapse prevention, and pharmacological support that can be provided by physician.;Education officer, environmental, assist with locating and accessing local/national Quit Smoking programs, and support quit date choice.    Expected Outcomes Short Term: Will demonstrate readiness to quit, by selecting a quit date.;Long Term: Complete abstinence from all tobacco products for at least 12 months from quit date.;Short Term: Will quit all tobacco product use, adhering to prevention of relapse plan.    Diabetes Yes    Intervention Provide education about signs/symptoms and action to take for hypo/hyperglycemia.;Provide education about proper nutrition, including hydration, and aerobic/resistive exercise prescription along with prescribed medications to achieve blood glucose in normal ranges: Fasting glucose 65-99 mg/dL    Expected Outcomes Long Term: Attainment of HbA1C < 7%.;Short Term: Participant verbalizes understanding of the signs/symptoms and immediate care of hyper/hypoglycemia, proper foot care and importance of medication, aerobic/resistive exercise and nutrition plan for blood glucose control.    Hypertension Yes    Intervention Provide education on lifestyle modifcations including regular physical activity/exercise, weight management, moderate sodium restriction and increased consumption of fresh fruit, vegetables, and low fat dairy, alcohol moderation, and smoking cessation.;Monitor prescription use compliance.    Expected Outcomes Short Term: Continued assessment and intervention until BP is < 140/8mm HG in hypertensive participants. < 130/105mm HG in hypertensive participants with diabetes, heart failure or chronic kidney disease.;Long Term: Maintenance of blood pressure at goal levels.          Education:Diabetes - Individual verbal and written instruction to review signs/symptoms of diabetes, desired ranges of glucose level fasting, after meals and with exercise.  Acknowledge that pre and post exercise glucose checks will be done for 3 sessions at entry of program. Flowsheet  Row Cardiac Rehab from 07/13/2023 in [facility] Cardiac and Pulmonary Rehab  Date 07/13/23  Educator [facility]  Instruction Review Code 1- Verbalizes Understanding    Core Components/Risk Factors/Patient Goals Review:    Core Components/Risk Factors/Patient Goals at Discharge (Final Review):    ITP Comments:  ITP Comments     Row Name 07/08/23 1440 07/13/23 1145 07/17/23 1051 07/22/23 0943     ITP Comments Virtual Visit completed. Patient informed on EP and RD appointment and 6 Minute walk test. Patient also informed of patient health questionnaires on My Chart. Patient Verbalizes understanding. Visit diagnosis can be found in [facility] 06/01/2023. Completed and gym orientation for cardiac rehab. Initial ITP created and sent for review to Dr. Oneil Pinal, Medical Director. First full day of exercise!  Patient was oriented to gym and equipment including functions, settings, policies, and procedures.  Patient's individual exercise prescription and treatment plan were reviewed.  All starting workloads were established based on the results of the 6 minute walk test done at initial orientation visit.  The plan for exercise progression was also introduced and progression will be customized based on patient's performance and goals. 30 Day review completed. Medical Director ITP review done, changes made as directed, and signed approval by Medical Director. new to program       Comments: 30 day review

## 2023-07-22 NOTE — Progress Notes (Signed)
 Daily Session Note  Patient Details  Name: Daniel Crosby MRN: [id number] Date of Birth: Oct 02, 1960 Referring Provider:   Flowsheet Row Cardiac Rehab from 07/13/2023 in [facility] Cardiac and Pulmonary Rehab  Referring Provider Dr. Georgine Mc    Encounter Date: 07/22/2023  Check In:  Session Check In - 07/22/23 1031       Check-In   Supervising physician immediately available to respond to emergencies See telemetry face sheet for immediately available ER MD    Location [facility]-Cardiac & Pulmonary Rehab    Staff Present Burnard Davenport RN,BSN,MPA;Joseph Rolinda RCP,RRT,BSRT;Jason Elnor RDN,LDN;Margaret Best, MS, Exercise Physiologist    Virtual Visit No    Medication changes reported     No    Fall or balance concerns reported    No    Tobacco Cessation No Change    Warm-up and Cool-down Performed on first and last piece of equipment    Resistance Training Performed Yes    VAD Patient? No    PAD/SET Patient? No      Pain Assessment   Currently in Pain? No/denies             Social History   Tobacco Use  Smoking Status Never  Smokeless Tobacco Current   Types: Snuff    Goals Met:  Independence with exercise equipment Exercise tolerated well No report of concerns or symptoms today Strength training completed today  Goals Unmet:  Not Applicable  Comments: Pt able to follow exercise prescription today without complaint.  Will continue to monitor for progression.    Dr. Oneil Pinal is Medical Director for [facility] Cardiac Rehabilitation.  Dr. Fuad Aleskerov is Medical Director for [facility] Pulmonary Rehabilitation.

## 2023-07-22 NOTE — Progress Notes (Signed)
 30 Day review completed. Medical Director ITP review done, changes made as directed, and signed approval by Medical Director. New to program.

## 2023-07-27 ENCOUNTER — Encounter

## 2023-07-27 DIAGNOSIS — Z952 Presence of prosthetic heart valve: Secondary | ICD-10-CM

## 2023-07-27 NOTE — Progress Notes (Signed)
 Daily Session Note  Patient Details  Name: Daniel Crosby MRN: [id number] Date of Birth: 1960/02/17 Referring Provider:   Flowsheet Row Cardiac Rehab from 07/13/2023 in [facility] Cardiac and Pulmonary Rehab  Referring Provider Dr. Georgine Mc    Encounter Date: 07/27/2023  Check In:  Session Check In - 07/27/23 1109       Check-In   Supervising physician immediately available to respond to emergencies See telemetry face sheet for immediately available ER MD    Location [facility]-Cardiac & Pulmonary Rehab    Staff Present Burnard Davenport RN,BSN,MPA;Joseph Rolinda RCP,RRT,BSRT;Meredith Tressa RN,BSN    Virtual Visit No    Medication changes reported     No    Fall or balance concerns reported    No    Tobacco Cessation No Change    Warm-up and Cool-down Performed on first and last piece of equipment    Resistance Training Performed Yes    VAD Patient? No    PAD/SET Patient? No      Pain Assessment   Currently in Pain? No/denies             Social History   Tobacco Use  Smoking Status Never  Smokeless Tobacco Current   Types: Snuff    Goals Met:  Independence with exercise equipment Exercise tolerated well No report of concerns or symptoms today Strength training completed today  Goals Unmet:  Not Applicable  Comments: Pt able to follow exercise prescription today without complaint.  Will continue to monitor for progression.    Dr. Oneil Pinal is Medical Director for [facility] Cardiac Rehabilitation.  Dr. Fuad Aleskerov is Medical Director for [facility] Pulmonary Rehabilitation.

## 2023-07-29 ENCOUNTER — Encounter

## 2023-07-29 DIAGNOSIS — Z952 Presence of prosthetic heart valve: Secondary | ICD-10-CM

## 2023-07-29 DIAGNOSIS — Z95828 Presence of other vascular implants and grafts: Secondary | ICD-10-CM

## 2023-07-29 NOTE — Progress Notes (Signed)
 Daily Session Note  Patient Details  Name: Daniel Crosby MRN: [id number] Date of Birth: 06-01-1960 Referring Provider:   Flowsheet Row Cardiac Rehab from 07/13/2023 in [facility] Cardiac and Pulmonary Rehab  Referring Provider Dr. Georgine Mc    Encounter Date: 07/29/2023  Check In:  Session Check In - 07/29/23 1020       Check-In   Supervising physician immediately available to respond to emergencies See telemetry face sheet for immediately available ER MD    Location [facility]-Cardiac & Pulmonary Rehab    Staff Present Rollene Paterson, MS, Exercise Physiologist;Jason Elnor RDN,LDN;Adithi Gammon RN,BSN,MPA;Joseph Rolinda RCP,RRT,BSRT    Virtual Visit No    Medication changes reported     No    Fall or balance concerns reported    No    Tobacco Cessation No Change    Warm-up and Cool-down Performed on first and last piece of equipment    Resistance Training Performed Yes    VAD Patient? No    PAD/SET Patient? No      Pain Assessment   Currently in Pain? No/denies             Social History   Tobacco Use  Smoking Status Never  Smokeless Tobacco Current   Types: Snuff    Goals Met:  Independence with exercise equipment Exercise tolerated well No report of concerns or symptoms today Strength training completed today  Goals Unmet:  Not Applicable  Comments: Pt able to follow exercise prescription today without complaint.  Will continue to monitor for progression.    Dr. Oneil Pinal is Medical Director for [facility] Cardiac Rehabilitation.  Dr. Fuad Aleskerov is Medical Director for [facility] Pulmonary Rehabilitation.

## 2023-07-31 ENCOUNTER — Encounter: Admitting: *Deleted

## 2023-07-31 DIAGNOSIS — Z952 Presence of prosthetic heart valve: Secondary | ICD-10-CM | POA: Diagnosis not present

## 2023-07-31 NOTE — Progress Notes (Signed)
 Daily Session Note  Patient Details  Name: Daniel Crosby MRN: [id number] Date of Birth: Dec 21, 1960 Referring Provider:   Flowsheet Row Cardiac Rehab from 07/13/2023 in [facility] Cardiac and Pulmonary Rehab  Referring Provider Dr. Georgine Mc    Encounter Date: 07/31/2023  Check In:  Session Check In - 07/31/23 1123       Check-In   Supervising physician immediately available to respond to emergencies See telemetry face sheet for immediately available ER MD    Location [facility]-Cardiac & Pulmonary Rehab    Staff Present Othel Durand, RN, BSN, CCRP;Meredith Tressa RN,BSN;Maxon [employer], Exercise Physiologist;Joseph Hood RCP,RRT,BSRT    Virtual Visit No    Medication changes reported     No    Fall or balance concerns reported    No    Warm-up and Cool-down Performed on first and last piece of equipment    Resistance Training Performed Yes    VAD Patient? No    PAD/SET Patient? No      Pain Assessment   Currently in Pain? No/denies             Social History   Tobacco Use  Smoking Status Never  Smokeless Tobacco Current   Types: Snuff    Goals Met:  Independence with exercise equipment Exercise tolerated well No report of concerns or symptoms today  Goals Unmet:  Not Applicable  Comments: Pt able to follow exercise prescription today without complaint.  Will continue to monitor for progression.    Dr. Oneil Pinal is Medical Director for [facility] Cardiac Rehabilitation.  Dr. Fuad Aleskerov is Medical Director for [facility] Pulmonary Rehabilitation.

## 2023-08-03 ENCOUNTER — Encounter

## 2023-08-05 ENCOUNTER — Encounter: Admitting: *Deleted

## 2023-08-05 ENCOUNTER — Encounter

## 2023-08-05 DIAGNOSIS — Z952 Presence of prosthetic heart valve: Secondary | ICD-10-CM

## 2023-08-05 NOTE — Progress Notes (Signed)
 Daily Session Note  Patient Details  Name: Daniel Crosby MRN: [id number] Date of Birth: July 10, 1960 Referring Provider:   Flowsheet Row Cardiac Rehab from 07/13/2023 in [facility] Cardiac and Pulmonary Rehab  Referring Provider Dr. Georgine Mc    Encounter Date: 08/05/2023  Check In:  Session Check In - 08/05/23 1409       Check-In   Supervising physician immediately available to respond to emergencies See telemetry face sheet for immediately available ER MD    Location [facility]-Cardiac & Pulmonary Rehab    Staff Present Hoy Rodney RN,BSN;Margaret Best, MS, Exercise Physiologist;Kelly Dyane HECKLE, ACSM CEP, Exercise Physiologist;Kelly Bollinger [facility]    Virtual Visit No    Medication changes reported     No    Fall or balance concerns reported    No    Warm-up and Cool-down Performed on first and last piece of equipment    Resistance Training Performed Yes    VAD Patient? No    PAD/SET Patient? No      Pain Assessment   Currently in Pain? No/denies             Social History   Tobacco Use  Smoking Status Never  Smokeless Tobacco Current   Types: Snuff    Goals Met:  Independence with exercise equipment Exercise tolerated well No report of concerns or symptoms today Strength training completed today  Goals Unmet:  Not Applicable  Comments: Pt able to follow exercise prescription today without complaint.  Will continue to monitor for progression.    Dr. Oneil Pinal is Medical Director for [facility] Cardiac Rehabilitation.  Dr. Fuad Aleskerov is Medical Director for [facility] Pulmonary Rehabilitation.

## 2023-08-06 ENCOUNTER — Encounter

## 2023-08-07 ENCOUNTER — Encounter

## 2023-08-10 ENCOUNTER — Encounter

## 2023-08-10 ENCOUNTER — Encounter: Admitting: *Deleted

## 2023-08-10 DIAGNOSIS — Z952 Presence of prosthetic heart valve: Secondary | ICD-10-CM

## 2023-08-10 NOTE — Progress Notes (Signed)
 Daily Session Note  Patient Details  Name: Daniel Crosby MRN: [id number] Date of Birth: 12-Apr-1960 Referring Provider:   Flowsheet Row Cardiac Rehab from 07/13/2023 in [facility] Cardiac and Pulmonary Rehab  Referring Provider Dr. Georgine Mc    Encounter Date: 08/10/2023  Check In:  Session Check In - 08/10/23 1347       Check-In   Supervising physician immediately available to respond to emergencies See telemetry face sheet for immediately available ER MD    Location [facility]-Cardiac & Pulmonary Rehab    Staff Present Hoy Rodney RN,BSN;Margaret Best, MS, Exercise Physiologist;Maxon Conetta BS, Exercise Physiologist;Noah Tickle, BS, Exercise Physiologist    Virtual Visit No    Medication changes reported     Yes    Comments increased warfarin two days a week    Fall or balance concerns reported    No    Warm-up and Cool-down Performed on first and last piece of equipment    Resistance Training Performed Yes    VAD Patient? No    PAD/SET Patient? No      Pain Assessment   Currently in Pain? No/denies             Social History   Tobacco Use  Smoking Status Never  Smokeless Tobacco Current   Types: Snuff    Goals Met:  Independence with exercise equipment Exercise tolerated well No report of concerns or symptoms today Strength training completed today  Goals Unmet:  Not Applicable  Comments: Pt able to follow exercise prescription today without complaint.  Will continue to monitor for progression.    Dr. Oneil Pinal is Medical Director for [facility] Cardiac Rehabilitation.  Dr. Fuad Aleskerov is Medical Director for [facility] Pulmonary Rehabilitation.

## 2023-08-12 ENCOUNTER — Encounter

## 2023-08-13 ENCOUNTER — Encounter: Admitting: *Deleted

## 2023-08-13 DIAGNOSIS — Z952 Presence of prosthetic heart valve: Secondary | ICD-10-CM

## 2023-08-13 NOTE — Progress Notes (Signed)
 Daily Session Note  Patient Details  Name: Daniel Crosby MRN: [id number] Date of Birth: November 13, 1960 Referring Provider:   Flowsheet Row Cardiac Rehab from 07/13/2023 in [facility] Cardiac and Pulmonary Rehab  Referring Provider Dr. Georgine Mc    Encounter Date: 08/13/2023  Check In:  Session Check In - 08/13/23 1355       Check-In   Supervising physician immediately available to respond to emergencies See telemetry face sheet for immediately available ER MD    Location [facility]-Cardiac & Pulmonary Rehab    Staff Present Hoy Rodney RN,BSN;Maxon Conetta BS, Exercise Physiologist;Noah Tickle, BS, Exercise Physiologist;Kristen Coble RN,BC,MSN    Virtual Visit No    Medication changes reported     No    Fall or balance concerns reported    No    Warm-up and Cool-down Performed on first and last piece of equipment    Resistance Training Performed Yes    VAD Patient? No    PAD/SET Patient? No      Pain Assessment   Currently in Pain? No/denies             Social History   Tobacco Use  Smoking Status Never  Smokeless Tobacco Current   Types: Snuff    Goals Met:  Independence with exercise equipment Exercise tolerated well No report of concerns or symptoms today Strength training completed today  Goals Unmet:  Not Applicable  Comments: Pt able to follow exercise prescription today without complaint.  Will continue to monitor for progression.    Dr. Oneil Pinal is Medical Director for [facility] Cardiac Rehabilitation.  Dr. Fuad Aleskerov is Medical Director for [facility] Pulmonary Rehabilitation.

## 2023-08-14 ENCOUNTER — Encounter

## 2023-08-17 ENCOUNTER — Encounter

## 2023-08-17 ENCOUNTER — Encounter: Admitting: *Deleted

## 2023-08-17 DIAGNOSIS — Z952 Presence of prosthetic heart valve: Secondary | ICD-10-CM

## 2023-08-17 NOTE — Progress Notes (Signed)
 Daily Session Note  Patient Details  Name: Daniel Crosby MRN: [id number] Date of Birth: 17-Nov-1960 Referring Provider:   Flowsheet Row Cardiac Rehab from 07/13/2023 in [facility] Cardiac and Pulmonary Rehab  Referring Provider Dr. Georgine Mc    Encounter Date: 08/17/2023  Check In:  Session Check In - 08/17/23 1348       Check-In   Supervising physician immediately available to respond to emergencies See telemetry face sheet for immediately available ER MD    Location [facility]-Cardiac & Pulmonary Rehab    Staff Present Hoy Rodney RN,BSN;Maxon Conetta BS, Exercise Physiologist;Noah Tickle, BS, Exercise Physiologist;Margaret Best, MS, Exercise Physiologist    Virtual Visit No    Medication changes reported     No    Fall or balance concerns reported    No    Warm-up and Cool-down Performed on first and last piece of equipment    Resistance Training Performed Yes    VAD Patient? No    PAD/SET Patient? No      Pain Assessment   Currently in Pain? No/denies             Social History   Tobacco Use  Smoking Status Never  Smokeless Tobacco Current   Types: Snuff    Goals Met:  Independence with exercise equipment Exercise tolerated well No report of concerns or symptoms today Strength training completed today  Goals Unmet:  Not Applicable  Comments: Pt able to follow exercise prescription today without complaint.  Will continue to monitor for progression.    Dr. Oneil Pinal is Medical Director for [facility] Cardiac Rehabilitation.  Dr. Fuad Aleskerov is Medical Director for [facility] Pulmonary Rehabilitation.

## 2023-08-19 ENCOUNTER — Encounter

## 2023-08-19 DIAGNOSIS — Z95828 Presence of other vascular implants and grafts: Secondary | ICD-10-CM

## 2023-08-19 DIAGNOSIS — Z952 Presence of prosthetic heart valve: Secondary | ICD-10-CM

## 2023-08-19 NOTE — Progress Notes (Signed)
 Cardiac Individual Treatment Plan  Patient Details  Name: Daniel Crosby MRN: [id number] Date of Birth: Apr 08, 1960 Referring Provider:   Flowsheet Row Cardiac Rehab from 07/13/2023 in [facility] Cardiac and Pulmonary Rehab  Referring Provider Dr. 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  Patient's Home Medications on Admission:  Current Outpatient Medications:    amLODipine (NORVASC) 10 MG tablet, Take 10 mg by mouth daily., Disp: , Rfl:    aspirin EC 81 MG tablet, Take 81 mg by mouth., Disp: , Rfl:    citalopram (CELEXA) 40 MG tablet, Take 40 mg by mouth., Disp: , Rfl:    DULoxetine (CYMBALTA) 60 MG capsule, Take 60 mg by mouth., Disp: , Rfl:    famotidine (PEPCID) 20 MG tablet, Take 20 mg by mouth 2 (two) times daily., Disp: , Rfl:    fluticasone (FLONASE) 50 MCG/ACT nasal spray, 2 sprays., Disp: , Rfl:    furosemide (LASIX) 40 MG tablet, Take 40 mg by mouth., Disp: , Rfl:    lisinopril (ZESTRIL) 40 MG tablet, Take 40 mg by mouth daily., Disp: , Rfl:    metFORMIN (GLUCOPHAGE) 500 MG tablet, TAKE 1 TABLET WITH         BREAKFAST, Disp: , Rfl:    naproxen  (NAPROSYN ) 500 MG tablet, Take 1 tablet (500 mg total) by mouth 2 (two) times daily with a meal., Disp: 14 tablet, Rfl: 0   oxyCODONE (OXY IR/ROXICODONE) 5 MG immediate release tablet, Take by mouth. (Patient not taking: Reported on 07/08/2023), Disp: , Rfl:    potassium chloride SA (KLOR-CON M) 20 MEQ tablet, Take 20 mEq by mouth., Disp: , Rfl:    pravastatin (PRAVACHOL) 40 MG tablet, SMARTSIG:1 Tablet(s) By Mouth Every Evening, Disp: , Rfl:    pravastatin (PRAVACHOL) 80 MG tablet, Take 80 mg by mouth., Disp: , Rfl:    Probiotic Product (PROBIOTIC DAILY) CAPS, Take 1 tablet by mouth every evening., Disp: , Rfl:    traZODone (DESYREL) 50 MG  tablet, Take 50 mg by mouth., Disp: , Rfl:    warfarin (COUMADIN) 10 MG tablet, Take 10 mg by mouth., Disp: , Rfl:   Past Medical History: No past medical history on file.  Tobacco Use: Social History   Tobacco Use  Smoking Status Never  Smokeless Tobacco Current   Types: Snuff    Labs: Review Flowsheet        No data to display           Exercise Target Goals: Exercise Program Goal: Individual exercise prescription set using results from initial 6 min walk test and THRR while considering  patient's activity barriers and safety.   Exercise Prescription Goal: Initial exercise prescription builds to 30-45 minutes a day of aerobic activity, 2-3 days per week.  Home exercise guidelines will be given to patient during program as part of exercise prescription that the participant will acknowledge.   Education: Aerobic Exercise: - Group verbal and visual presentation on the components of exercise prescription. Introduces F.I.T.T principle from ACSM for exercise prescriptions.  Reviews F.I.T.T. principles of aerobic exercise including progression. Written material given at graduation. Flowsheet Row Cardiac Rehab from 07/29/2023 in [facility] Cardiac and Pulmonary Rehab  Education need identified 07/13/23    Education: Resistance Exercise: - Group verbal and visual presentation on the components of exercise prescription.  Introduces F.I.T.T principle from ACSM for exercise prescriptions  Reviews F.I.T.T. principles of resistance exercise including progression. Written material given at graduation.    Education: Exercise & Equipment Safety: - Individual verbal instruction and demonstration of equipment use and safety with use of the equipment. Flowsheet Row Cardiac Rehab from 07/29/2023 in [facility] Cardiac and Pulmonary Rehab  Date 07/13/23  Educator [facility]  Instruction Review Code 1- Verbalizes Understanding    Education: Exercise Physiology & General Exercise Guidelines: - Group verbal and  written instruction with models to review the exercise physiology of the cardiovascular system and associated critical values. Provides general exercise guidelines with specific guidelines to those with heart or lung disease.    Education: Flexibility, Balance, Mind/Body Relaxation: - Group verbal and visual presentation with interactive activity on the components of exercise prescription. Introduces F.I.T.T principle from ACSM for exercise prescriptions. Reviews F.I.T.T. principles of flexibility and balance exercise training including progression. Also discusses the mind body connection.  Reviews various relaxation techniques to help reduce and manage stress (i.e. Deep breathing, progressive muscle relaxation, and visualization). Balance handout provided to take home. Written material given at graduation.   Activity Barriers & Risk Stratification:  Activity Barriers & Cardiac Risk Stratification - 07/13/23 1148       Activity Barriers & Cardiac Risk Stratification   Activity Barriers Joint Problems    Cardiac Risk Stratification Low          6 Minute Walk:  6 Minute Walk     Row Name 07/13/23 1145         6 Minute Walk   Phase Initial     Distance 1555 feet     Walk Time 6 minutes     # of Rest Breaks 0     MPH 2.9     METS 3.96     RPE 9     Perceived Dyspnea  0     VO2 Peak 13.87     Symptoms No     Resting HR 102 bpm     Resting BP 128/72     Resting Oxygen Saturation  95 %     Exercise Oxygen Saturation  during 6 min walk 97 %     Max Ex. HR 114 bpm     Max Ex. BP 156/74     2 Minute Post BP 134/72        Oxygen Initial Assessment:   Oxygen Re-Evaluation:   Oxygen Discharge (Final Oxygen Re-Evaluation):   Initial Exercise Prescription:  Initial Exercise Prescription - 07/13/23 1100       Date of Initial Exercise RX and Referring Provider   Date 07/13/23    Referring Provider Dr. Georgine Alemu      Oxygen   Maintain Oxygen Saturation 88% or higher       Treadmill   MPH 3    Grade 2    Minutes 15    METs 4.12      Elliptical   Level 1    Speed 3    Minutes 15    METs 3.96      Rower   Level 3    Watts 25    Minutes 15    METs 3.96      Prescription Details   Duration Progress to 30 minutes of continuous aerobic without signs/symptoms of physical distress      Intensity   THRR 40-80% of Max Heartrate 124-147    Ratings of Perceived Exertion 11-13  Perceived Dyspnea 0-4      Progression   Progression Continue to progress workloads to maintain intensity without signs/symptoms of physical distress.      Resistance Training   Training Prescription Yes    Weight 5lb    Reps 10-15          Perform Capillary Blood Glucose checks as needed.  Exercise Prescription Changes:   Exercise Prescription Changes     Row Name 07/13/23 1100 07/23/23 1500 08/05/23 1100 08/18/23 1300       Response to Exercise   Blood Pressure (Admit) 128/72 134/72 128/58 144/78    Blood Pressure (Exercise) 156/74 156/76 144/64 150/76    Blood Pressure (Exit) 134/72 126/66 122/62 114/68    Heart Rate (Admit) 102 bpm 109 bpm 105 bpm 94 bpm    Heart Rate (Exercise) 114 bpm 145 bpm 128 bpm 134 bpm    Heart Rate (Exit) 97 bpm 100 bpm 104 bpm 86 bpm    Oxygen Saturation (Admit) 95 % -- -- --    Oxygen Saturation (Exercise) 97 % -- -- --    Oxygen Saturation (Exit) 97 % -- -- --    Rating of Perceived Exertion (Exercise) 9 -- 14 15    Perceived Dyspnea (Exercise) 0 -- -- --    Symptoms none none none none    Comments results -- First 2 weeks of exercise sessions --    Duration -- -- Continue with 30 min of aerobic exercise without signs/symptoms of physical distress. Continue with 30 min of aerobic exercise without signs/symptoms of physical distress.    Intensity -- -- THRR unchanged THRR unchanged      Progression   Progression -- Continue to progress workloads to maintain intensity without signs/symptoms of physical distress.  Continue to progress workloads to maintain intensity without signs/symptoms of physical distress. Continue to progress workloads to maintain intensity without signs/symptoms of physical distress.    Average METs -- 3.12 4.31 5.14      Resistance Training   Training Prescription -- Yes Yes Yes    Weight -- 5lb 5lb 5lb    Reps -- 10-15 10-15 10-15      Interval Training   Interval Training -- -- No No      Treadmill   MPH -- 3 3.2 3.3    Grade -- 2 3 5     Minutes -- 15 15 15     METs -- 3.71 4.77 5.8      Elliptical   Level -- 1 -- 1    Speed -- 3 -- 3    Minutes -- 15 -- 15    METs -- 2.5 -- --      Biostep-RELP   Level -- -- -- 6    SPM -- -- -- 50    Minutes -- -- -- 15      Rower   Level -- -- 9 9    Watts -- -- 67 67    Minutes -- -- 15 15    METs -- -- 5.62 5.66      Oxygen   Maintain Oxygen Saturation -- -- 88% or higher 88% or higher       Exercise Comments:   Exercise Comments     Row Name 07/17/23 1051           Exercise Comments First full day of exercise!  Patient was oriented to gym and equipment including functions, settings, policies, and procedures.  Patient's individual exercise prescription and  treatment plan were reviewed.  All starting workloads were established based on the results of the 6 minute walk test done at initial orientation visit.  The plan for exercise progression was also introduced and progression will be customized based on patient's performance and goals.          Exercise Goals and Review:   Exercise Goals     Row Name 07/13/23 1151             Exercise Goals   Increase Physical Activity Yes       Intervention Provide advice, education, support and counseling about physical activity/exercise needs.;Develop an individualized exercise prescription for aerobic and resistive training based on initial evaluation findings, risk stratification, comorbidities and participant's personal goals.       Expected Outcomes Short  Term: Attend rehab on a regular basis to increase amount of physical activity.;Long Term: Add in home exercise to make exercise part of routine and to increase amount of physical activity.;Long Term: Exercising regularly at least 3-5 days a week.       Increase Strength and Stamina Yes       Intervention Develop an individualized exercise prescription for aerobic and resistive training based on initial evaluation findings, risk stratification, comorbidities and participant's personal goals.;Provide advice, education, support and counseling about physical activity/exercise needs.       Expected Outcomes Short Term: Increase workloads from initial exercise prescription for resistance, speed, and METs.;Short Term: Perform resistance training exercises routinely during rehab and add in resistance training at home;Long Term: Improve cardiorespiratory fitness, muscular endurance and strength as measured by increased METs and functional capacity ( )       Able to understand and use rate of perceived exertion (RPE) scale Yes       Intervention Provide education and explanation on how to use RPE scale       Expected Outcomes Short Term: Able to use RPE daily in rehab to express subjective intensity level;Long Term:  Able to use RPE to guide intensity level when exercising independently       Able to understand and use Dyspnea scale Yes       Intervention Provide education and explanation on how to use Dyspnea scale       Expected Outcomes Short Term: Able to use Dyspnea scale daily in rehab to express subjective sense of shortness of breath during exertion;Long Term: Able to use Dyspnea scale to guide intensity level when exercising independently       Knowledge and understanding of Target Heart Rate Range (THRR) Yes       Intervention Provide education and explanation of THRR including how the numbers were predicted and where they are located for reference       Expected Outcomes Short Term: Able to  state/look up THRR;Long Term: Able to use THRR to govern intensity when exercising independently;Short Term: Able to use daily as guideline for intensity in rehab       Able to check pulse independently Yes       Intervention Provide education and demonstration on how to check pulse in carotid and radial arteries.;Review the importance of being able to check your own pulse for safety during independent exercise       Expected Outcomes Short Term: Able to explain why pulse checking is important during independent exercise;Long Term: Able to check pulse independently and accurately       Understanding of Exercise Prescription Yes       Intervention Provide education, explanation,  and written materials on patient's individual exercise prescription       Expected Outcomes Short Term: Able to explain program exercise prescription;Long Term: Able to explain home exercise prescription to exercise independently          Exercise Goals Re-Evaluation :  Exercise Goals Re-Evaluation     Row Name 07/17/23 1052 07/23/23 1529 08/05/23 1151 08/18/23 1346       Exercise Goal Re-Evaluation   Exercise Goals Review Increase Physical Activity;Able to understand and use rate of perceived exertion (RPE) scale;Knowledge and understanding of Target Heart Rate Range (THRR);Understanding of Exercise Prescription;Increase Strength and Stamina;Able to understand and use Dyspnea scale;Able to check pulse independently Increase Physical Activity;Increase Strength and Stamina;Understanding of Exercise Prescription Increase Physical Activity;Increase Strength and Stamina;Understanding of Exercise Prescription Increase Physical Activity;Increase Strength and Stamina;Understanding of Exercise Prescription    Comments Reviewed RPE and dyspnea scale, THR and program prescription with pt today.  Pt voiced understanding and was given a copy of goals to take home. Review of Binyamin exercie progress completed . He is new to the program  He  exercised with the TM at and 1% incline and ELP  at level Kolbie is off to a good start in the program and completed his first 2 weeks of exercise sessions in this review. He already increased his treadmill workload to a speed of 3.2 mph and incline of 3%. He also already increased on the rower to level 9 with an average of 67 watts. We will continue to monitor his progress in the program. Ford is doing well in rehab. He increased his workload on the treadmill to a speed of 3.3 mph and incline of 5%. He maintained level 9 on the rower with an average watt of 67. He tried the elliptical at level 1. He also added the biostep at level 6. We will continue to monitor his progress in the program.    Expected Outcomes Short: Use RPE daily to regulate intensity. Long: Follow program prescription in THR. STG continue to attend scheduled sessions,  progression as tolerated.  LTG  continue with exercise progression Short: Continue to follow current exercise prescription. Long: Continue exercise to improve strength and stamina. Short: Continue to progressively increase treadmill workload. Long: Continue exercise to improve strength and stamina.       Discharge Exercise Prescription (Final Exercise Prescription Changes):  Exercise Prescription Changes - 08/18/23 1300       Response to Exercise   Blood Pressure (Admit) 144/78    Blood Pressure (Exercise) 150/76    Blood Pressure (Exit) 114/68    Heart Rate (Admit) 94 bpm    Heart Rate (Exercise) 134 bpm    Heart Rate (Exit) 86 bpm    Rating of Perceived Exertion (Exercise) 15    Symptoms none    Duration Continue with 30 min of aerobic exercise without signs/symptoms of physical distress.    Intensity THRR unchanged      Progression   Progression Continue to progress workloads to maintain intensity without signs/symptoms of physical distress.    Average METs 5.14      Resistance Training   Training Prescription Yes    Weight 5lb    Reps 10-15       Interval Training   Interval Training No      Treadmill   MPH 3.3    Grade 5    Minutes 15    METs 5.8      Elliptical   Level 1  Speed 3    Minutes 15      Biostep-RELP   Level 6    SPM 50    Minutes 15      Rower   Level 9    Watts 67    Minutes 15    METs 5.66      Oxygen   Maintain Oxygen Saturation 88% or higher          Nutrition:  Target Goals: Understanding of nutrition guidelines, daily intake of sodium 1500mg , cholesterol 200mg , calories 30% from fat and 7% or less from saturated fats, daily to have 5 or more servings of fruits and vegetables.  Education: All About Nutrition: -Group instruction provided by verbal, written material, interactive activities, discussions, models, and posters to present general guidelines for heart healthy nutrition including fat, fiber, MyPlate, the role of sodium in heart healthy nutrition, utilization of the nutrition label, and utilization of this knowledge for meal planning. Follow up email sent as well. Written material given at graduation. Flowsheet Row Cardiac Rehab from 07/29/2023 in [facility] Cardiac and Pulmonary Rehab  Education need identified 07/13/23    Biometrics:  Pre Biometrics - 07/13/23 1152       Pre Biometrics   Height 5' 11 (1.803 m)    Weight 228 lb 1.6 oz (103.5 kg)    Waist Circumference 42.5 inches    Hip Circumference 39 inches    Waist to Hip Ratio 1.09 %    BMI (Calculated) 31.83    Single Leg Stand 30 seconds           Nutrition Therapy Plan and Nutrition Goals:  Nutrition Therapy & Goals - 07/13/23 1336       Nutrition Therapy   Diet Cardiac, Low Na    Protein (specify units) 90    Fiber 30 grams    Whole Grain Foods 3 servings    Saturated Fats 15 max. grams    Fruits and Vegetables 5 servings/day    Sodium 2 grams      Personal Nutrition Goals   Nutrition Goal Eat 15-30gProtein and 30-60gCarbs at each meal.    Personal Goal #2 Read labels and reduce sodium intake to  below 2300mg . Ideally 1500mg  per day.    Personal Goal #3 Reduce saturated fat, less than 12g per day. Replace bad fats for more heart healthy fats.    Comments Patient drinking fruit juice and almond milk mostly. Reviewed his food recall, spoke with him about his high sugar intake and low nutrient intake. Educated on quality and quality of carbohydrates. Provided handout on mediterranean diet. Educated on types of fats, sources, and how to read facts labels. Brainstormed several meal and snack ideas with foods he likes, focusing on building more balanced plates with more nutrient rich foods cutting back on added sugar.      Intervention Plan   Intervention Prescribe, educate and counsel regarding individualized specific dietary modifications aiming towards targeted core components such as weight, hypertension, lipid management, diabetes, heart failure and other comorbidities.;Nutrition handout(s) given to patient.    Expected Outcomes Short Term Goal: Understand basic principles of dietary content, such as calories, fat, sodium, cholesterol and nutrients.;Short Term Goal: A plan has been developed with personal nutrition goals set during dietitian appointment.;Long Term Goal: Adherence to prescribed nutrition plan.          Nutrition Assessments:  MEDIFICTS Score Key: >=70 Need to make dietary changes  40-70 Heart Healthy Diet <= 40 Therapeutic Level Cholesterol Diet  Flowsheet Row Cardiac Rehab from 07/13/2023 in [facility] Cardiac and Pulmonary Rehab  Picture Your Plate Total Score on Admission 68   Picture Your Plate Scores: <59 Unhealthy dietary pattern with much room for improvement. 41-50 Dietary pattern unlikely to meet recommendations for good health and room for improvement. 51-60 More healthful dietary pattern, with some room for improvement.  >60 Healthy dietary pattern, although there may be some specific behaviors that could be improved.    Nutrition Goals Re-Evaluation:   Nutrition Goals Re-Evaluation     Row Name 08/05/23 1411             Goals   Comment Mell reports that he does watch his sodium and has switched to diet sodas instead of regular. He has a protein drink at work and eats one main meal when he gets home. He tries to make sure that meal is balanced with carb, protein, and vegetables. He was encouraged to try to eat smaller more frequent meals but that is hard with his work schedule.       Expected Outcome Short: continue watching sodium and try to add smaller more frequent meals. Long: maintain heart healthy diet.          Nutrition Goals Discharge (Final Nutrition Goals Re-Evaluation):  Nutrition Goals Re-Evaluation - 08/05/23 1411       Goals   Comment Damarcus reports that he does watch his sodium and has switched to diet sodas instead of regular. He has a protein drink at work and eats one main meal when he gets home. He tries to make sure that meal is balanced with carb, protein, and vegetables. He was encouraged to try to eat smaller more frequent meals but that is hard with his work schedule.    Expected Outcome Short: continue watching sodium and try to add smaller more frequent meals. Long: maintain heart healthy diet.          Psychosocial: Target Goals: Acknowledge presence or absence of significant depression and/or stress, maximize coping skills, provide positive support system. Participant is able to verbalize types and ability to use techniques and skills needed for reducing stress and depression.   Education: Stress, Anxiety, and Depression - Group verbal and visual presentation to define topics covered.  Reviews how body is impacted by stress, anxiety, and depression.  Also discusses healthy ways to reduce stress and to treat/manage anxiety and depression.  Written material given at graduation.   Education: Sleep Hygiene -Provides group verbal and written instruction about how sleep can affect your health.  Define sleep  hygiene, discuss sleep cycles and impact of sleep habits. Review good sleep hygiene tips.    Initial Review & Psychosocial Screening:  Initial Psych Review & Screening - 07/08/23 1441       Initial Review   Current issues with Current Psychotropic Meds;Current Anxiety/Panic;Current Sleep Concerns      Family Dynamics   Good Support System? Yes    Comments Nezar has some anxiety when he is in crowed areas when he is out. He was sleeping alot for awhile and now he is sleeping ok. He takes medication for his sleep and has been helping for the most part.      Barriers   Psychosocial barriers to participate in program The patient should benefit from training in stress management and relaxation.;There are no identifiable barriers or psychosocial needs.      Screening Interventions   Interventions Provide feedback about the scores to participant;To provide support and resources  with identified psychosocial needs;Encouraged to exercise    Expected Outcomes Short Term goal: Utilizing psychosocial counselor, staff and physician to assist with identification of specific Stressors or current issues interfering with healing process. Setting desired goal for each stressor or current issue identified.;Long Term Goal: Stressors or current issues are controlled or eliminated.;Short Term goal: Identification and review with participant of any Quality of Life or Depression concerns found by scoring the questionnaire.;Long Term goal: The participant improves quality of Life and PHQ9 Scores as seen by post scores and/or verbalization of changes          Quality of Life Scores:   Quality of Life - 07/13/23 1153       Quality of Life   Select Quality of Life      Quality of Life Scores   Health/Function Pre 13.07 %    Socioeconomic Pre 12.88 %    Psych/Spiritual Pre 13.21 %    Family Pre 13.4 %    GLOBAL Pre 13.1 %         Scores of 19 and below usually indicate a poorer quality of life in these  areas.  A difference of  2-3 points is a clinically meaningful difference.  A difference of 2-3 points in the total score of the Quality of Life Index has been associated with significant improvement in overall quality of life, self-image, physical symptoms, and general health in studies assessing change in quality of life.  PHQ-9: Review Flowsheet       07/13/2023  Depression screen PHQ 2/9  Decreased Interest 0  Down, Depressed, Hopeless 0  PHQ - 2 Score 0  Altered sleeping 3  Tired, decreased energy 0  Change in appetite 0  Feeling bad or failure about yourself  0  Trouble concentrating 0  Moving slowly or fidgety/restless 0  Suicidal thoughts 0  PHQ-9 Score 3  Difficult doing work/chores Not difficult at all   Interpretation of Total Score  Total Score Depression Severity:  1-4 = Minimal depression, 5-9 = Mild depression, 10-14 = Moderate depression, 15-19 = Moderately severe depression, 20-27 = Severe depression   Psychosocial Evaluation and Intervention:  Psychosocial Evaluation - 07/08/23 1444       Psychosocial Evaluation & Interventions   Interventions Encouraged to exercise with the program and follow exercise prescription;Relaxation education;Stress management education    Comments Merick has some anxiety when he is in crowed areas when he is out. He was sleeping alot for awhile and now he is sleeping ok. He takes medication for his sleep and has been helping for the most part.    Expected Outcomes Short: Start [facility] to help with mood. Long: Maintain a healthy mental state    Continue Psychosocial Services  Follow up required by staff          Psychosocial Re-Evaluation:  Psychosocial Re-Evaluation     Row Name 08/05/23 1422             Psychosocial Re-Evaluation   Current issues with Current Psychotropic Meds;Current Anxiety/Panic;Current Sleep Concerns       Comments Elford stated that he does not sleep well but that is nothing new and there have been  no changes recently in sleep patterns. He did start back to work which has caused stress levels to increase some but he states he is mostly able to manage his stress levels. He continue to take meds for his mood and to control anxiety and he reports that they are working and all  of that has been stable.       Expected Outcomes Short: continue to adjust to being back to work and managing that new stress. Long: continue to exercise for the mental health benefits of exericse.       Interventions Encouraged to attend Cardiac Rehabilitation for the exercise       Continue Psychosocial Services  Follow up required by staff          Psychosocial Discharge (Final Psychosocial Re-Evaluation):  Psychosocial Re-Evaluation - 08/05/23 1422       Psychosocial Re-Evaluation   Current issues with Current Psychotropic Meds;Current Anxiety/Panic;Current Sleep Concerns    Comments Jasyah stated that he does not sleep well but that is nothing new and there have been no changes recently in sleep patterns. He did start back to work which has caused stress levels to increase some but he states he is mostly able to manage his stress levels. He continue to take meds for his mood and to control anxiety and he reports that they are working and all of that has been stable.    Expected Outcomes Short: continue to adjust to being back to work and managing that new stress. Long: continue to exercise for the mental health benefits of exericse.    Interventions Encouraged to attend Cardiac Rehabilitation for the exercise    Continue Psychosocial Services  Follow up required by staff          Vocational Rehabilitation: Provide vocational rehab assistance to qualifying candidates.   Vocational Rehab Evaluation & Intervention:   Education: Education Goals: Education classes will be provided on a variety of topics geared toward better understanding of heart health and risk factor modification. Participant will state  understanding/return demonstration of topics presented as noted by education test scores.  Learning Barriers/Preferences:  Learning Barriers/Preferences - 07/08/23 1438       Learning Barriers/Preferences   Learning Barriers None    Learning Preferences None          General Cardiac Education Topics:  AED/CPR: - Group verbal and written instruction with the use of models to demonstrate the basic use of the AED with the basic ABC's of resuscitation.   Anatomy and Cardiac Procedures: - Group verbal and visual presentation and models provide information about basic cardiac anatomy and function. Reviews the testing methods done to diagnose heart disease and the outcomes of the test results. Describes the treatment choices: Medical Management, Angioplasty, or Coronary Bypass Surgery for treating various heart conditions including Myocardial Infarction, Angina, Valve Disease, and Cardiac Arrhythmias.  Written material given at graduation. Flowsheet Row Cardiac Rehab from 07/29/2023 in [facility] Cardiac and Pulmonary Rehab  Education need identified 07/13/23    Medication Safety: - Group verbal and visual instruction to review commonly prescribed medications for heart and lung disease. Reviews the medication, class of the drug, and side effects. Includes the steps to properly store meds and maintain the prescription regimen.  Written material given at graduation.   Intimacy: - Group verbal instruction through game format to discuss how heart and lung disease can affect sexual intimacy. Written material given at graduation..   Know Your Numbers and Heart Failure: - Group verbal and visual instruction to discuss disease risk factors for cardiac and pulmonary disease and treatment options.  Reviews associated critical values for Overweight/Obesity, Hypertension, Cholesterol, and Diabetes.  Discusses basics of heart failure: signs/symptoms and treatments.  Introduces Heart Failure Zone chart for  action plan for heart failure.  Written material given at  graduation. Flowsheet Row Cardiac Rehab from 07/29/2023 in [facility] Cardiac and Pulmonary Rehab  Date 07/22/23  Educator sb  Instruction Review Code 1- Verbalizes Understanding    Infection Prevention: - Provides verbal and written material to individual with discussion of infection control including proper hand washing and proper equipment cleaning during exercise session. Flowsheet Row Cardiac Rehab from 07/29/2023 in [facility] Cardiac and Pulmonary Rehab  Date 07/13/23  Educator [facility]  Instruction Review Code 1- Verbalizes Understanding    Falls Prevention: - Provides verbal and written material to individual with discussion of falls prevention and safety. Flowsheet Row Cardiac Rehab from 07/29/2023 in [facility] Cardiac and Pulmonary Rehab  Date 07/13/23  Educator [facility]  Instruction Review Code 1- Verbalizes Understanding    Other: -Provides group and verbal instruction on various topics (see comments)   Knowledge Questionnaire Score:  Knowledge Questionnaire Score - 07/13/23 1154       Knowledge Questionnaire Score   Pre Score 23/26          Core Components/Risk Factors/Patient Goals at Admission:  Personal Goals and Risk Factors at Admission - 07/08/23 1437       Core Components/Risk Factors/Patient Goals on Admission    Weight Management Yes;Weight Loss    Intervention Weight Management: Develop a combined nutrition and exercise program designed to reach desired caloric intake, while maintaining appropriate intake of nutrient and fiber, sodium and fats, and appropriate energy expenditure required for the weight goal.;Weight Management: Provide education and appropriate resources to help participant work on and attain dietary goals.;Obesity: Provide education and appropriate resources to help participant work on and attain dietary goals.;Weight Management/Obesity: Establish reasonable short term and long term weight goals.    Expected  Outcomes Short Term: Continue to assess and modify interventions until short term weight is achieved;Weight Loss: Understanding of general recommendations for a balanced deficit meal plan, which promotes 1-2 lb weight loss per week and includes a negative energy balance of [phone] kcal/d;Understanding recommendations for meals to include 15-35% energy as protein, 25-35% energy from fat, 35-60% energy from carbohydrates, less than 200mg  of dietary cholesterol, 20-35 gm of total fiber daily;Understanding of distribution of calorie intake throughout the day with the consumption of 4-5 meals/snacks    Tobacco Cessation Yes    Number of packs per day 0    Intervention Assist the participant in steps to quit. Provide individualized education and counseling about committing to Tobacco Cessation, relapse prevention, and pharmacological support that can be provided by physician.;Education officer, environmental, assist with locating and accessing local/national Quit Smoking programs, and support quit date choice.    Expected Outcomes Short Term: Will demonstrate readiness to quit, by selecting a quit date.;Long Term: Complete abstinence from all tobacco products for at least 12 months from quit date.;Short Term: Will quit all tobacco product use, adhering to prevention of relapse plan.    Diabetes Yes    Intervention Provide education about signs/symptoms and action to take for hypo/hyperglycemia.;Provide education about proper nutrition, including hydration, and aerobic/resistive exercise prescription along with prescribed medications to achieve blood glucose in normal ranges: Fasting glucose 65-99 mg/dL    Expected Outcomes Long Term: Attainment of HbA1C < 7%.;Short Term: Participant verbalizes understanding of the signs/symptoms and immediate care of hyper/hypoglycemia, proper foot care and importance of medication, aerobic/resistive exercise and nutrition plan for blood glucose control.    Hypertension Yes     Intervention Provide education on lifestyle modifcations including regular physical activity/exercise, weight management, moderate sodium restriction and increased consumption of fresh  fruit, vegetables, and low fat dairy, alcohol moderation, and smoking cessation.;Monitor prescription use compliance.    Expected Outcomes Short Term: Continued assessment and intervention until BP is < 140/34mm HG in hypertensive participants. < 130/43mm HG in hypertensive participants with diabetes, heart failure or chronic kidney disease.;Long Term: Maintenance of blood pressure at goal levels.          Education:Diabetes - Individual verbal and written instruction to review signs/symptoms of diabetes, desired ranges of glucose level fasting, after meals and with exercise. Acknowledge that pre and post exercise glucose checks will be done for 3 sessions at entry of program. Flowsheet Row Cardiac Rehab from 07/29/2023 in [facility] Cardiac and Pulmonary Rehab  Date 07/13/23  Educator [facility]  Instruction Review Code 1- Verbalizes Understanding    Core Components/Risk Factors/Patient Goals Review:   Goals and Risk Factor Review     Row Name 08/05/23 1418             Core Components/Risk Factors/Patient Goals Review   Personal Goals Review Hypertension;Diabetes       Review Patient was asked about monitoring blood glucose and he stated no one ever told him to check his blood sugars at home and he doesn't have a monitor. He was encouraged to aske his doctor about that and see if he can get a blood glucose monitor for home if his doctor wants him to be checking blood sugars. He does take his DM meds regularly and follows up with doctor for all lab work and appointments. He does have a blood pressure cuff and was encouraged to start checking blood pressure at home.       Expected Outcomes Short: talk to doctor about getting a blood glucose meter. start checking blood pressure at home. Long: control cardiac risk factors.           Core Components/Risk Factors/Patient Goals at Discharge (Final Review):   Goals and Risk Factor Review - 08/05/23 1418       Core Components/Risk Factors/Patient Goals Review   Personal Goals Review Hypertension;Diabetes    Review Patient was asked about monitoring blood glucose and he stated no one ever told him to check his blood sugars at home and he doesn't have a monitor. He was encouraged to aske his doctor about that and see if he can get a blood glucose monitor for home if his doctor wants him to be checking blood sugars. He does take his DM meds regularly and follows up with doctor for all lab work and appointments. He does have a blood pressure cuff and was encouraged to start checking blood pressure at home.    Expected Outcomes Short: talk to doctor about getting a blood glucose meter. start checking blood pressure at home. Long: control cardiac risk factors.          ITP Comments:  ITP Comments     Row Name 07/08/23 1440 07/13/23 1145 07/17/23 1051 07/22/23 0943 08/19/23 0824   ITP Comments Virtual Visit completed. Patient informed on EP and RD appointment and 6 Minute walk test. Patient also informed of patient health questionnaires on My Chart. Patient Verbalizes understanding. Visit diagnosis can be found in [facility] 06/01/2023. Completed and gym orientation for cardiac rehab. Initial ITP created and sent for review to Dr. Oneil Pinal, Medical Director. First full day of exercise!  Patient was oriented to gym and equipment including functions, settings, policies, and procedures.  Patient's individual exercise prescription and treatment plan were reviewed.  All starting workloads  were established based on the results of the 6 minute walk test done at initial orientation visit.  The plan for exercise progression was also introduced and progression will be customized based on patient's performance and goals. 30 Day review completed. Medical Director ITP review done, changes made  as directed, and signed approval by Medical Director. new to program 30 Day review completed. Medical Director ITP review done, changes made as directed, and signed approval by Medical Director.      Comments: 30 day review

## 2023-08-20 ENCOUNTER — Encounter: Admitting: *Deleted

## 2023-08-20 DIAGNOSIS — Z952 Presence of prosthetic heart valve: Secondary | ICD-10-CM | POA: Diagnosis not present

## 2023-08-20 NOTE — Progress Notes (Signed)
 Daily Session Note  Patient Details  Name: Daniel Crosby MRN: [id number] Date of Birth: 11/11/1960 Referring Provider:   Flowsheet Row Cardiac Rehab from 07/13/2023 in [facility] Cardiac and Pulmonary Rehab  Referring Provider Dr. Georgine Mc    Encounter Date: 08/20/2023  Check In:  Session Check In - 08/20/23 1413       Check-In   Supervising physician immediately available to respond to emergencies See telemetry face sheet for immediately available ER MD    Location [facility]-Cardiac & Pulmonary Rehab    Staff Present Hoy Rodney RN,BSN;Joseph Rolinda RCP,RRT,BSRT;Noah Tickle, [place], Exercise Physiologist;Maxon Conetta BS, Exercise Physiologist    Virtual Visit No    Medication changes reported     No    Fall or balance concerns reported    No    Warm-up and Cool-down Performed on first and last piece of equipment    Resistance Training Performed Yes    VAD Patient? No    PAD/SET Patient? No      Pain Assessment   Currently in Pain? No/denies             Social History   Tobacco Use  Smoking Status Never  Smokeless Tobacco Current   Types: Snuff    Goals Met:  Independence with exercise equipment Exercise tolerated well No report of concerns or symptoms today Strength training completed today  Goals Unmet:  Not Applicable  Comments: Pt able to follow exercise prescription today without complaint.  Will continue to monitor for progression.    Dr. Oneil Pinal is Medical Director for [facility] Cardiac Rehabilitation.  Dr. Fuad Aleskerov is Medical Director for [facility] Pulmonary Rehabilitation.

## 2023-08-21 ENCOUNTER — Encounter

## 2023-08-24 ENCOUNTER — Encounter: Attending: Internal Medicine | Admitting: *Deleted

## 2023-08-24 ENCOUNTER — Encounter

## 2023-08-24 DIAGNOSIS — Z48812 Encounter for surgical aftercare following surgery on the circulatory system: Secondary | ICD-10-CM | POA: Diagnosis present

## 2023-08-24 DIAGNOSIS — Z95828 Presence of other vascular implants and grafts: Secondary | ICD-10-CM | POA: Insufficient documentation

## 2023-08-24 DIAGNOSIS — Z952 Presence of prosthetic heart valve: Secondary | ICD-10-CM | POA: Diagnosis present

## 2023-08-24 NOTE — Progress Notes (Signed)
 Daily Session Note  Patient Details  Name: Daniel Crosby MRN: [id number] Date of Birth: June 21, 1960 Referring Provider:   Flowsheet Row Cardiac Rehab from 07/13/2023 in [facility] Cardiac and Pulmonary Rehab  Referring Provider Dr. Georgine Mc    Encounter Date: 08/24/2023  Check In:  Session Check In - 08/24/23 1433       Check-In   Supervising physician immediately available to respond to emergencies See telemetry face sheet for immediately available ER MD    Location [facility]-Cardiac & Pulmonary Rehab    Staff Present Rollene Paterson, MS, Exercise Physiologist;Laureen Delores, BS, RRT, CPFT;Shelle Galdamez Tressa RN,BSN;Noah Tickle, BS, Exercise Physiologist    Virtual Visit No    Medication changes reported     No    Fall or balance concerns reported    No    Warm-up and Cool-down Performed on first and last piece of equipment    Resistance Training Performed Yes    VAD Patient? No    PAD/SET Patient? No      Pain Assessment   Currently in Pain? No/denies             Social History   Tobacco Use  Smoking Status Never  Smokeless Tobacco Current   Types: Snuff    Goals Met:  Independence with exercise equipment Exercise tolerated well No report of concerns or symptoms today Strength training completed today  Goals Unmet:  Not Applicable  Comments: Pt able to follow exercise prescription today without complaint.  Will continue to monitor for progression.    Dr. Oneil Pinal is Medical Director for [facility] Cardiac Rehabilitation.  Dr. Fuad Aleskerov is Medical Director for [facility] Pulmonary Rehabilitation.

## 2023-08-26 ENCOUNTER — Encounter

## 2023-08-26 ENCOUNTER — Encounter: Admitting: *Deleted

## 2023-08-26 DIAGNOSIS — Z48812 Encounter for surgical aftercare following surgery on the circulatory system: Secondary | ICD-10-CM | POA: Diagnosis not present

## 2023-08-26 DIAGNOSIS — Z952 Presence of prosthetic heart valve: Secondary | ICD-10-CM

## 2023-08-26 NOTE — Progress Notes (Signed)
 Daily Session Note  Patient Details  Name: Daniel Crosby MRN: [id number] Date of Birth: 1960-09-03 Referring Provider:   Flowsheet Row Cardiac Rehab from 07/13/2023 in [facility] Cardiac and Pulmonary Rehab  Referring Provider Dr. Georgine Mc    Encounter Date: 08/26/2023  Check In:  Session Check In - 08/26/23 1413       Check-In   Supervising physician immediately available to respond to emergencies See telemetry face sheet for immediately available ER MD    Location [facility]-Cardiac & Pulmonary Rehab    Staff Present Burnard Davenport RN,BSN,MPA;Avianna Moynahan Tressa RN,BSN;Noah Tickle, BS, Exercise Physiologist;Kelly Dyane HECKLE, ACSM CEP, Exercise Physiologist    Virtual Visit No    Medication changes reported     Yes    Comments started Lasix 20 mg    Fall or balance concerns reported    No    Warm-up and Cool-down Performed on first and last piece of equipment    Resistance Training Performed Yes    VAD Patient? No    PAD/SET Patient? No      Pain Assessment   Currently in Pain? No/denies             Social History   Tobacco Use  Smoking Status Never  Smokeless Tobacco Current   Types: Snuff    Goals Met:  Independence with exercise equipment Exercise tolerated well No report of concerns or symptoms today Strength training completed today  Goals Unmet:  Not Applicable  Comments: Pt able to follow exercise prescription today without complaint.  Will continue to monitor for progression.   Reviewed home exercise with pt today.  Pt plans to walk and use hand weights for exercise.  Reviewed THR, pulse, RPE, sign and symptoms, pulse oximetery and when to call 911 or MD.  Also discussed weather considerations and indoor options.  Pt voiced understanding.    Dr. Oneil Pinal is Medical Director for [facility] Cardiac Rehabilitation.  Dr. Fuad Aleskerov is Medical Director for [facility] Pulmonary Rehabilitation.

## 2023-08-27 ENCOUNTER — Encounter: Admitting: *Deleted

## 2023-08-27 DIAGNOSIS — Z952 Presence of prosthetic heart valve: Secondary | ICD-10-CM

## 2023-08-27 DIAGNOSIS — Z48812 Encounter for surgical aftercare following surgery on the circulatory system: Secondary | ICD-10-CM | POA: Diagnosis not present

## 2023-08-27 NOTE — Progress Notes (Signed)
 Daily Session Note  Patient Details  Name: Daniel Crosby MRN: [id number] Date of Birth: August 21, 1960 Referring Provider:   Flowsheet Row Cardiac Rehab from 07/13/2023 in [facility] Cardiac and Pulmonary Rehab  Referring Provider Dr. Georgine Mc    Encounter Date: 08/27/2023  Check In:  Session Check In - 08/27/23 1422       Check-In   Supervising physician immediately available to respond to emergencies See telemetry face sheet for immediately available ER MD    Location [facility]-Cardiac & Pulmonary Rehab    Staff Present Hoy Rodney RN,BSN;Joseph Rolinda RCP,RRT,BSRT;Noah Tickle, [place], Exercise Physiologist;Maxon Conetta BS, Exercise Physiologist    Virtual Visit No    Medication changes reported     No    Fall or balance concerns reported    No    Warm-up and Cool-down Performed on first and last piece of equipment    Resistance Training Performed Yes    VAD Patient? No    PAD/SET Patient? No      Pain Assessment   Currently in Pain? No/denies             Social History   Tobacco Use  Smoking Status Never  Smokeless Tobacco Current   Types: Snuff    Goals Met:  Independence with exercise equipment Exercise tolerated well No report of concerns or symptoms today Strength training completed today  Goals Unmet:  Not Applicable  Comments: Pt able to follow exercise prescription today without complaint.  Will continue to monitor for progression.    Dr. Oneil Pinal is Medical Director for [facility] Cardiac Rehabilitation.  Dr. Fuad Aleskerov is Medical Director for [facility] Pulmonary Rehabilitation.

## 2023-08-28 ENCOUNTER — Encounter

## 2023-08-31 ENCOUNTER — Encounter

## 2023-08-31 ENCOUNTER — Encounter: Admitting: *Deleted

## 2023-08-31 DIAGNOSIS — Z95828 Presence of other vascular implants and grafts: Secondary | ICD-10-CM

## 2023-08-31 DIAGNOSIS — Z952 Presence of prosthetic heart valve: Secondary | ICD-10-CM

## 2023-08-31 DIAGNOSIS — Z48812 Encounter for surgical aftercare following surgery on the circulatory system: Secondary | ICD-10-CM | POA: Diagnosis not present

## 2023-08-31 NOTE — Progress Notes (Signed)
 Daily Session Note  Patient Details  Name: Riordan Walle MRN: [id number] Date of Birth: 1960-08-30 Referring Provider:   Flowsheet Row Cardiac Rehab from 07/13/2023 in [facility] Cardiac and Pulmonary Rehab  Referring Provider Dr. Georgine Mc    Encounter Date: 08/31/2023  Check In:  Session Check In - 08/31/23 1341       Check-In   Supervising physician immediately available to respond to emergencies See telemetry face sheet for immediately available ER MD    Location [facility]-Cardiac & Pulmonary Rehab    Staff Present Maxon Conetta BS, Exercise Physiologist;Noah Tickle, BS, Exercise Physiologist;Meredith Tressa RN,BSN;Margaret Best, MS, Exercise Physiologist    Virtual Visit No    Medication changes reported     No    Fall or balance concerns reported    No    Tobacco Cessation No Change    Warm-up and Cool-down Performed on first and last piece of equipment    Resistance Training Performed Yes    VAD Patient? No    PAD/SET Patient? No      Pain Assessment   Currently in Pain? No/denies             Social History   Tobacco Use  Smoking Status Never  Smokeless Tobacco Current   Types: Snuff    Goals Met:  Independence with exercise equipment Exercise tolerated well No report of concerns or symptoms today Strength training completed today  Goals Unmet:  Not Applicable  Comments: Pt able to follow exercise prescription today without complaint.  Will continue to monitor for progression.    Dr. Oneil Pinal is Medical Director for [facility] Cardiac Rehabilitation.  Dr. Fuad Aleskerov is Medical Director for [facility] Pulmonary Rehabilitation.

## 2023-09-02 ENCOUNTER — Encounter

## 2023-09-03 ENCOUNTER — Encounter: Admitting: Emergency Medicine

## 2023-09-03 DIAGNOSIS — Z952 Presence of prosthetic heart valve: Secondary | ICD-10-CM

## 2023-09-03 DIAGNOSIS — Z48812 Encounter for surgical aftercare following surgery on the circulatory system: Secondary | ICD-10-CM | POA: Diagnosis not present

## 2023-09-03 NOTE — Progress Notes (Signed)
 Daily Session Note  Patient Details  Name: Daniel Crosby MRN: [id number] Date of Birth: December 17, 1960 Referring Provider:   Flowsheet Row Cardiac Rehab from 07/13/2023 in [facility] Cardiac and Pulmonary Rehab  Referring Provider Dr. Georgine Mc    Encounter Date: 09/03/2023  Check In:  Session Check In - 09/03/23 1345       Check-In   Supervising physician immediately available to respond to emergencies See telemetry face sheet for immediately available ER MD    Location [facility]-Cardiac & Pulmonary Rehab    Staff Present Fairy Plater RCP,RRT,BSRT;Maxon Conetta BS, Exercise Physiologist;Noah Tickle, BS, Exercise Physiologist    Virtual Visit No    Medication changes reported     No    Fall or balance concerns reported    No    Tobacco Cessation No Change    Warm-up and Cool-down Performed on first and last piece of equipment    Resistance Training Performed Yes    VAD Patient? No    PAD/SET Patient? No      Pain Assessment   Currently in Pain? No/denies             Social History   Tobacco Use  Smoking Status Never  Smokeless Tobacco Current   Types: Snuff    Goals Met:  Independence with exercise equipment Exercise tolerated well No report of concerns or symptoms today Strength training completed today  Goals Unmet:  Not Applicable  Comments: Pt able to follow exercise prescription today without complaint.  Will continue to monitor for progression.    Dr. Oneil Pinal is Medical Director for [facility] Cardiac Rehabilitation.  Dr. Fuad Aleskerov is Medical Director for [facility] Pulmonary Rehabilitation.

## 2023-09-04 ENCOUNTER — Encounter

## 2023-09-07 ENCOUNTER — Encounter

## 2023-09-07 ENCOUNTER — Encounter: Admitting: Emergency Medicine

## 2023-09-07 DIAGNOSIS — Z48812 Encounter for surgical aftercare following surgery on the circulatory system: Secondary | ICD-10-CM | POA: Diagnosis not present

## 2023-09-07 DIAGNOSIS — Z952 Presence of prosthetic heart valve: Secondary | ICD-10-CM

## 2023-09-07 NOTE — Progress Notes (Signed)
 Daily Session Note  Patient Details  Name: Daniel Crosby MRN: [id number] Date of Birth: 1960-01-27 Referring Provider:   Flowsheet Row Cardiac Rehab from 07/13/2023 in [facility] Cardiac and Pulmonary Rehab  Referring Provider Dr. Georgine Mc    Encounter Date: 09/07/2023  Check In:  Session Check In - 09/07/23 1353       Check-In   Supervising physician immediately available to respond to emergencies See telemetry face sheet for immediately available ER MD    Location [facility]-Cardiac & Pulmonary Rehab    Staff Present Othel Durand, RN, BSN, CCRP;Maxon Conetta BS, Exercise Physiologist;Lauren Quantrell Splitt RN,BSN;Noah Tickle, BS, Exercise Physiologist    Virtual Visit No    Medication changes reported     No    Fall or balance concerns reported    No    Tobacco Cessation No Change    Warm-up and Cool-down Performed on first and last piece of equipment    Resistance Training Performed Yes    VAD Patient? No    PAD/SET Patient? No      Pain Assessment   Currently in Pain? No/denies             Social History   Tobacco Use  Smoking Status Never  Smokeless Tobacco Current   Types: Snuff    Goals Met:  Independence with exercise equipment Exercise tolerated well No report of concerns or symptoms today Strength training completed today  Goals Unmet:  Not Applicable  Comments: Pt able to follow exercise prescription today without complaint.  Will continue to monitor for progression.    Dr. Oneil Pinal is Medical Director for [facility] Cardiac Rehabilitation.  Dr. Fuad Aleskerov is Medical Director for [facility] Pulmonary Rehabilitation.

## 2023-09-09 ENCOUNTER — Encounter

## 2023-09-09 DIAGNOSIS — Z48812 Encounter for surgical aftercare following surgery on the circulatory system: Secondary | ICD-10-CM | POA: Diagnosis not present

## 2023-09-09 DIAGNOSIS — Z95828 Presence of other vascular implants and grafts: Secondary | ICD-10-CM

## 2023-09-09 DIAGNOSIS — Z952 Presence of prosthetic heart valve: Secondary | ICD-10-CM

## 2023-09-09 NOTE — Progress Notes (Signed)
 Daily Session Note  Patient Details  Name: Daniel Crosby MRN: [id number] Date of Birth: 06-May-1960 Referring Provider:   Flowsheet Row Cardiac Rehab from 07/13/2023 in [facility] Cardiac and Pulmonary Rehab  Referring Provider Dr. Georgine Mc    Encounter Date: 09/09/2023  Check In:  Session Check In - 09/09/23 1346       Check-In   Supervising physician immediately available to respond to emergencies See telemetry face sheet for immediately available ER MD    Location [facility]-Cardiac & Pulmonary Rehab    Staff Present Burnard Davenport RN,BSN,MPA;Meredith Tressa RN,BSN;Laura Cates RN,BSN;Alexzandra Bilton Dyane BS, ACSM CEP, Exercise Physiologist;Jason Elnor RDN,LDN    Virtual Visit No    Medication changes reported     No    Fall or balance concerns reported    No    Tobacco Cessation No Change    Warm-up and Cool-down Performed on first and last piece of equipment    Resistance Training Performed Yes    VAD Patient? No    PAD/SET Patient? No      Pain Assessment   Currently in Pain? No/denies             Social History   Tobacco Use  Smoking Status Never  Smokeless Tobacco Current   Types: Snuff    Goals Met:  Independence with exercise equipment Exercise tolerated well No report of concerns or symptoms today Strength training completed today  Goals Unmet:  Not Applicable  Comments: Pt able to follow exercise prescription today without complaint.  Will continue to monitor for progression.    Dr. Oneil Pinal is Medical Director for [facility] Cardiac Rehabilitation.  Dr. Fuad Aleskerov is Medical Director for [facility] Pulmonary Rehabilitation.

## 2023-09-10 ENCOUNTER — Encounter: Admitting: *Deleted

## 2023-09-10 DIAGNOSIS — Z48812 Encounter for surgical aftercare following surgery on the circulatory system: Secondary | ICD-10-CM | POA: Diagnosis not present

## 2023-09-10 DIAGNOSIS — Z952 Presence of prosthetic heart valve: Secondary | ICD-10-CM

## 2023-09-10 NOTE — Progress Notes (Signed)
 Daily Session Note  Patient Details  Name: Daniel Crosby MRN: [id number] Date of Birth: 1960/06/20 Referring Provider:   Flowsheet Row Cardiac Rehab from 07/13/2023 in [facility] Cardiac and Pulmonary Rehab  Referring Provider Dr. Georgine Mc    Encounter Date: 09/10/2023  Check In:  Session Check In - 09/10/23 1339       Check-In   Supervising physician immediately available to respond to emergencies See telemetry face sheet for immediately available ER MD    Location [facility]-Cardiac & Pulmonary Rehab    Staff Present Leita Franks RN,BSN;Noah Tickle, BS, Exercise Physiologist;Joseph Rolinda RCP,RRT,BSRT;Audry Kauzlarich Tressa RN,BSN    Virtual Visit No    Medication changes reported     No    Fall or balance concerns reported    No    Warm-up and Cool-down Performed on first and last piece of equipment    Resistance Training Performed Yes    VAD Patient? No    PAD/SET Patient? No      Pain Assessment   Currently in Pain? No/denies             Social History   Tobacco Use  Smoking Status Never  Smokeless Tobacco Current   Types: Snuff    Goals Met:  Independence with exercise equipment Exercise tolerated well No report of concerns or symptoms today Strength training completed today  Goals Unmet:  Not Applicable  Comments: Pt able to follow exercise prescription today without complaint.  Will continue to monitor for progression.    Dr. Oneil Pinal is Medical Director for [facility] Cardiac Rehabilitation.  Dr. Fuad Aleskerov is Medical Director for [facility] Pulmonary Rehabilitation.

## 2023-09-11 ENCOUNTER — Encounter

## 2023-09-14 ENCOUNTER — Encounter

## 2023-09-14 ENCOUNTER — Encounter: Admitting: Emergency Medicine

## 2023-09-14 DIAGNOSIS — Z95828 Presence of other vascular implants and grafts: Secondary | ICD-10-CM

## 2023-09-14 DIAGNOSIS — Z48812 Encounter for surgical aftercare following surgery on the circulatory system: Secondary | ICD-10-CM | POA: Diagnosis not present

## 2023-09-14 DIAGNOSIS — Z952 Presence of prosthetic heart valve: Secondary | ICD-10-CM

## 2023-09-14 NOTE — Progress Notes (Signed)
 Daily Session Note  Patient Details  Name: Daniel Crosby MRN: [id number] Date of Birth: 17-Jul-1960 Referring Provider:   Flowsheet Row Cardiac Rehab from 07/13/2023 in [facility] Cardiac and Pulmonary Rehab  Referring Provider Dr. Georgine Mc    Encounter Date: 09/14/2023  Check In:  Session Check In - 09/14/23 1349       Check-In   Supervising physician immediately available to respond to emergencies See telemetry face sheet for immediately available ER MD    Location [facility]-Cardiac & Pulmonary Rehab    Staff Present Leita Franks RN,BSN;Maxon Conetta BS, Exercise Physiologist;Noah Tickle, BS, Exercise Physiologist;Margaret Best, MS, Exercise Physiologist    Virtual Visit No    Medication changes reported     No    Fall or balance concerns reported    No    Tobacco Cessation No Change    Warm-up and Cool-down Performed on first and last piece of equipment    Resistance Training Performed Yes    VAD Patient? No    PAD/SET Patient? No      Pain Assessment   Currently in Pain? No/denies             Social History   Tobacco Use  Smoking Status Never  Smokeless Tobacco Current   Types: Snuff    Goals Met:  Independence with exercise equipment Exercise tolerated well No report of concerns or symptoms today Strength training completed today  Goals Unmet:  Not Applicable  Comments: Pt able to follow exercise prescription today without complaint.  Will continue to monitor for progression.    Dr. Oneil Pinal is Medical Director for [facility] Cardiac Rehabilitation.  Dr. Fuad Aleskerov is Medical Director for [facility] Pulmonary Rehabilitation.

## 2023-09-16 ENCOUNTER — Encounter

## 2023-09-16 DIAGNOSIS — Z95828 Presence of other vascular implants and grafts: Secondary | ICD-10-CM

## 2023-09-16 DIAGNOSIS — Z952 Presence of prosthetic heart valve: Secondary | ICD-10-CM

## 2023-09-16 NOTE — Progress Notes (Signed)
 Cardiac Individual Treatment Plan  Patient Details  Name: Daniel Crosby MRN: [id number] Date of Birth: Dec 18, 1960 Referring Provider:   Flowsheet Row Cardiac Rehab from 07/13/2023 in [facility] Cardiac and Pulmonary Rehab  Referring Provider Dr. Georgine Mc    Initial Encounter Date:  Flowsheet Row Cardiac Rehab from 07/13/2023 in [facility] Cardiac and Pulmonary Rehab  Date 07/13/23    Visit Diagnosis: Hx of ascending aorta replacement  S/P TAVR (transcatheter aortic valve replacement)  Patient's Home Medications on Admission:  Current Outpatient Medications:    amLODipine (NORVASC) 10 MG tablet, Take 10 mg by mouth daily., Disp: , Rfl:    aspirin EC 81 MG tablet, Take 81 mg by mouth., Disp: , Rfl:    citalopram (CELEXA) 40 MG tablet, Take 40 mg by mouth., Disp: , Rfl:    DULoxetine (CYMBALTA) 60 MG capsule, Take 60 mg by mouth., Disp: , Rfl:    famotidine (PEPCID) 20 MG tablet, Take 20 mg by mouth 2 (two) times daily., Disp: , Rfl:    fluticasone (FLONASE) 50 MCG/ACT nasal spray, 2 sprays., Disp: , Rfl:    furosemide (LASIX) 40 MG tablet, Take 40 mg by mouth., Disp: , Rfl:    lisinopril (ZESTRIL) 40 MG tablet, Take 40 mg by mouth daily., Disp: , Rfl:    metFORMIN (GLUCOPHAGE) 500 MG tablet, TAKE 1 TABLET WITH         BREAKFAST, Disp: , Rfl:    naproxen  (NAPROSYN ) 500 MG tablet, Take 1 tablet (500 mg total) by mouth 2 (two) times daily with a meal., Disp: 14 tablet, Rfl: 0   oxyCODONE (OXY IR/ROXICODONE) 5 MG immediate release tablet, Take by mouth. (Patient not taking: Reported on 07/08/2023), Disp: , Rfl:    potassium chloride SA (KLOR-CON M) 20 MEQ tablet, Take 20 mEq by mouth., Disp: , Rfl:    pravastatin (PRAVACHOL) 40 MG tablet, SMARTSIG:1 Tablet(s) By Mouth Every Evening, Disp: , Rfl:    pravastatin (PRAVACHOL) 80 MG tablet, Take 80 mg by mouth., Disp: , Rfl:    Probiotic Product (PROBIOTIC DAILY) CAPS, Take 1 tablet by mouth every evening., Disp: , Rfl:    traZODone (DESYREL) 50 MG  tablet, Take 50 mg by mouth., Disp: , Rfl:    warfarin (COUMADIN) 10 MG tablet, Take 10 mg by mouth., Disp: , Rfl:   Past Medical History: No past medical history on file.  Tobacco Use: Social History   Tobacco Use  Smoking Status Never  Smokeless Tobacco Current   Types: Snuff    Labs: Review Flowsheet        No data to display           Exercise Target Goals: Exercise Program Goal: Individual exercise prescription set using results from initial 6 min walk test and THRR while considering  patient's activity barriers and safety.   Exercise Prescription Goal: Initial exercise prescription builds to 30-45 minutes a day of aerobic activity, 2-3 days per week.  Home exercise guidelines will be given to patient during program as part of exercise prescription that the participant will acknowledge.   Education: Aerobic Exercise: - Group verbal and visual presentation on the components of exercise prescription. Introduces F.I.T.T principle from ACSM for exercise prescriptions.  Reviews F.I.T.T. principles of aerobic exercise including progression. Written material provided at class time. Flowsheet Row Cardiac Rehab from 09/09/2023 in [facility] Cardiac and Pulmonary Rehab  Education need identified 07/13/23  Date 08/26/23  Educator [facility]  Instruction Review Code 1- [employer] Understanding    Education:  Resistance Exercise: - Group verbal and visual presentation on the components of exercise prescription. Introduces F.I.T.T principle from ACSM for exercise prescriptions  Reviews F.I.T.T. principles of resistance exercise including progression. Written material provided at class time.    Education: Exercise & Equipment Safety: - Individual verbal instruction and demonstration of equipment use and safety with use of the equipment. Flowsheet Row Cardiac Rehab from 09/09/2023 in [facility] Cardiac and Pulmonary Rehab  Date 07/13/23  Educator [facility]  Instruction Review Code 1- Verbalizes  Understanding    Education: Exercise Physiology & General Exercise Guidelines: - Group verbal and written instruction with models to review the exercise physiology of the cardiovascular system and associated critical values. Provides general exercise guidelines with specific guidelines to those with heart or lung disease. Written material provided at class time.   Education: Flexibility, Balance, Mind/Body Relaxation: - Group verbal and visual presentation with interactive activity on the components of exercise prescription. Introduces F.I.T.T principle from ACSM for exercise prescriptions. Reviews F.I.T.T. principles of flexibility and balance exercise training including progression. Also discusses the mind body connection.  Reviews various relaxation techniques to help reduce and manage stress (i.e. Deep breathing, progressive muscle relaxation, and visualization). Balance handout provided to take home. Written material provided at class time.   Activity Barriers & Risk Stratification:  Activity Barriers & Cardiac Risk Stratification - 07/13/23 1148       Activity Barriers & Cardiac Risk Stratification   Activity Barriers Joint Problems    Cardiac Risk Stratification Low          6 Minute Walk:  6 Minute Walk     Row Name 07/13/23 1145         6 Minute Walk   Phase Initial     Distance 1555 feet     Walk Time 6 minutes     # of Rest Breaks 0     MPH 2.9     METS 3.96     RPE 9     Perceived Dyspnea  0     VO2 Peak 13.87     Symptoms No     Resting HR 102 bpm     Resting BP 128/72     Resting Oxygen Saturation  95 %     Exercise Oxygen Saturation  during 6 min walk 97 %     Max Ex. HR 114 bpm     Max Ex. BP 156/74     2 Minute Post BP 134/72        Oxygen Initial Assessment:   Oxygen Re-Evaluation:   Oxygen Discharge (Final Oxygen Re-Evaluation):   Initial Exercise Prescription:  Initial Exercise Prescription - 07/13/23 1100       Date of Initial  Exercise RX and Referring Provider   Date 07/13/23    Referring Provider Dr. Georgine Alemu      Oxygen   Maintain Oxygen Saturation 88% or higher      Treadmill   MPH 3    Grade 2    Minutes 15    METs 4.12      Elliptical   Level 1    Speed 3    Minutes 15    METs 3.96      Rower   Level 3    Watts 25    Minutes 15    METs 3.96      Prescription Details   Duration Progress to 30 minutes of continuous aerobic without signs/symptoms of physical distress  Intensity   THRR 40-80% of Max Heartrate 124-147    Ratings of Perceived Exertion 11-13    Perceived Dyspnea 0-4      Progression   Progression Continue to progress workloads to maintain intensity without signs/symptoms of physical distress.      Resistance Training   Training Prescription Yes    Weight 5lb    Reps 10-15          Perform Capillary Blood Glucose checks as needed.  Exercise Prescription Changes:   Exercise Prescription Changes     Row Name 07/13/23 1100 07/23/23 1500 08/05/23 1100 08/18/23 1300 08/26/23 1400     Response to Exercise   Blood Pressure (Admit) 128/72 134/72 128/58 144/78 --   Blood Pressure (Exercise) 156/74 156/76 144/64 150/76 --   Blood Pressure (Exit) 134/72 126/66 122/62 114/68 --   Heart Rate (Admit) 102 bpm 109 bpm 105 bpm 94 bpm --   Heart Rate (Exercise) 114 bpm 145 bpm 128 bpm 134 bpm --   Heart Rate (Exit) 97 bpm 100 bpm 104 bpm 86 bpm --   Oxygen Saturation (Admit) 95 % -- -- -- --   Oxygen Saturation (Exercise) 97 % -- -- -- --   Oxygen Saturation (Exit) 97 % -- -- -- --   Rating of Perceived Exertion (Exercise) 9 -- 14 15 --   Perceived Dyspnea (Exercise) 0 -- -- -- --   Symptoms none none none none --   Comments results -- First 2 weeks of exercise sessions -- --   Duration -- -- Continue with 30 min of aerobic exercise without signs/symptoms of physical distress. Continue with 30 min of aerobic exercise without signs/symptoms of physical distress.  Continue with 30 min of aerobic exercise without signs/symptoms of physical distress.   Intensity -- -- THRR unchanged THRR unchanged THRR unchanged     Progression   Progression -- Continue to progress workloads to maintain intensity without signs/symptoms of physical distress. Continue to progress workloads to maintain intensity without signs/symptoms of physical distress. Continue to progress workloads to maintain intensity without signs/symptoms of physical distress. Continue to progress workloads to maintain intensity without signs/symptoms of physical distress.   Average METs -- 3.12 4.31 5.14 5.14     Resistance Training   Training Prescription -- Yes Yes Yes Yes   Weight -- 5lb 5lb 5lb 5lb   Reps -- 10-15 10-15 10-15 10-15     Interval Training   Interval Training -- -- No No No     Treadmill   MPH -- 3 3.2 3.3 3.3   Grade -- 2 3 5 5    Minutes -- 15 15 15 15    METs -- 3.71 4.77 5.8 5.8     Elliptical   Level -- 1 -- 1 1   Speed -- 3 -- 3 3   Minutes -- 15 -- 15 15   METs -- 2.5 -- -- --     Biostep-RELP   Level -- -- -- 6 6   SPM -- -- -- 50 50   Minutes -- -- -- 15 15     Rower   Level -- -- 9 9 9    Watts -- -- 67 67 67   Minutes -- -- 15 15 15    METs -- -- 5.62 5.66 5.66     Home Exercise Plan   Plans to continue exercise at -- -- -- -- Home (comment)  walk and use hand weights   Frequency -- -- -- --  Add 2 additional days to program exercise sessions.   Initial Home Exercises Provided -- -- -- -- 08/26/23     Oxygen   Maintain Oxygen Saturation -- -- 88% or higher 88% or higher 88% or higher    Row Name 09/01/23 1300             Response to Exercise   Blood Pressure (Admit) 122/78       Blood Pressure (Exit) 140/72       Heart Rate (Admit) 78 bpm       Heart Rate (Exercise) 142 bpm       Heart Rate (Exit) 112 bpm       Rating of Perceived Exertion (Exercise) 15       Symptoms none       Duration Continue with 30 min of aerobic exercise without  signs/symptoms of physical distress.       Intensity THRR unchanged         Progression   Progression Continue to progress workloads to maintain intensity without signs/symptoms of physical distress.       Average METs 5.16         Resistance Training   Training Prescription Yes       Weight 5 lb       Reps 10-15         Interval Training   Interval Training No         Treadmill   MPH 3.6       Grade 5       Minutes 15       METs 6.24         Elliptical   Level 3       Minutes 15       METs 3.7         REL-XR   Level 8       Minutes 15       METs 4.7         Home Exercise Plan   Plans to continue exercise at Home (comment)  walk and use hand weights       Frequency Add 2 additional days to program exercise sessions.       Initial Home Exercises Provided 08/26/23         Oxygen   Maintain Oxygen Saturation 88% or higher          Exercise Comments:   Exercise Comments     Row Name 07/17/23 1051           Exercise Comments First full day of exercise!  Patient was oriented to gym and equipment including functions, settings, policies, and procedures.  Patient's individual exercise prescription and treatment plan were reviewed.  All starting workloads were established based on the results of the 6 minute walk test done at initial orientation visit.  The plan for exercise progression was also introduced and progression will be customized based on patient's performance and goals.          Exercise Goals and Review:   Exercise Goals     Row Name 07/13/23 1151             Exercise Goals   Increase Physical Activity Yes       Intervention Provide advice, education, support and counseling about physical activity/exercise needs.;Develop an individualized exercise prescription for aerobic and resistive training based on initial evaluation findings, risk stratification, comorbidities and participant's personal goals.       Expected Outcomes  Short Term: Attend rehab  on a regular basis to increase amount of physical activity.;Long Term: Add in home exercise to make exercise part of routine and to increase amount of physical activity.;Long Term: Exercising regularly at least 3-5 days a week.       Increase Strength and Stamina Yes       Intervention Develop an individualized exercise prescription for aerobic and resistive training based on initial evaluation findings, risk stratification, comorbidities and participant's personal goals.;Provide advice, education, support and counseling about physical activity/exercise needs.       Expected Outcomes Short Term: Increase workloads from initial exercise prescription for resistance, speed, and METs.;Short Term: Perform resistance training exercises routinely during rehab and add in resistance training at home;Long Term: Improve cardiorespiratory fitness, muscular endurance and strength as measured by increased METs and functional capacity ( )       Able to understand and use rate of perceived exertion (RPE) scale Yes       Intervention Provide education and explanation on how to use RPE scale       Expected Outcomes Short Term: Able to use RPE daily in rehab to express subjective intensity level;Long Term:  Able to use RPE to guide intensity level when exercising independently       Able to understand and use Dyspnea scale Yes       Intervention Provide education and explanation on how to use Dyspnea scale       Expected Outcomes Short Term: Able to use Dyspnea scale daily in rehab to express subjective sense of shortness of breath during exertion;Long Term: Able to use Dyspnea scale to guide intensity level when exercising independently       Knowledge and understanding of Target Heart Rate Range (THRR) Yes       Intervention Provide education and explanation of THRR including how the numbers were predicted and where they are located for reference       Expected Outcomes Short Term: Able to state/look up THRR;Long  Term: Able to use THRR to govern intensity when exercising independently;Short Term: Able to use daily as guideline for intensity in rehab       Able to check pulse independently Yes       Intervention Provide education and demonstration on how to check pulse in carotid and radial arteries.;Review the importance of being able to check your own pulse for safety during independent exercise       Expected Outcomes Short Term: Able to explain why pulse checking is important during independent exercise;Long Term: Able to check pulse independently and accurately       Understanding of Exercise Prescription Yes       Intervention Provide education, explanation, and written materials on patient's individual exercise prescription       Expected Outcomes Short Term: Able to explain program exercise prescription;Long Term: Able to explain home exercise prescription to exercise independently          Exercise Goals Re-Evaluation :  Exercise Goals Re-Evaluation     Row Name 07/17/23 1052 07/23/23 1529 08/05/23 1151 08/18/23 1346 08/26/23 1425     Exercise Goal Re-Evaluation   Exercise Goals Review Increase Physical Activity;Able to understand and use rate of perceived exertion (RPE) scale;Knowledge and understanding of Target Heart Rate Range (THRR);Understanding of Exercise Prescription;Increase Strength and Stamina;Able to understand and use Dyspnea scale;Able to check pulse independently Increase Physical Activity;Increase Strength and Stamina;Understanding of Exercise Prescription Increase Physical Activity;Increase Strength and Stamina;Understanding of Exercise Prescription Increase Physical  Activity;Increase Strength and Stamina;Understanding of Exercise Prescription Increase Physical Activity;Increase Strength and Stamina;Able to understand and use rate of perceived exertion (RPE) scale;Able to understand and use Dyspnea scale;Knowledge and understanding of Target Heart Rate Range (THRR);Able to check  pulse independently;Understanding of Exercise Prescription   Comments Reviewed RPE and dyspnea scale, THR and program prescription with pt today.  Pt voiced understanding and was given a copy of goals to take home. Review of Million exercie progress completed . He is new to the program  He exercised with the TM at and 1% incline and ELP  at level [place] is off to a good start in the program and completed his first 2 weeks of exercise sessions in this review. He already increased his treadmill workload to a speed of 3.2 mph and incline of 3%. He also already increased on the rower to level 9 with an average of 67 watts. We will continue to monitor his progress in the program. Ishaan is doing well in rehab. He increased his workload on the treadmill to a speed of 3.3 mph and incline of 5%. He maintained level 9 on the rower with an average watt of 67. He tried the elliptical at level 1. He also added the biostep at level 6. We will continue to monitor his progress in the program. Reviewed home exercise with pt today.  Pt plans to walk and use hand weights for exercise.  Reviewed THR, pulse, RPE, sign and symptoms, pulse oximetery and when to call 911 or MD.  Also discussed weather considerations and indoor options.  Pt voiced understanding.   Expected Outcomes Short: Use RPE daily to regulate intensity. Long: Follow program prescription in THR. STG continue to attend scheduled sessions,  progression as tolerated.  LTG  continue with exercise progression Short: Continue to follow current exercise prescription. Long: Continue exercise to improve strength and stamina. Short: Continue to progressively increase treadmill workload. Long: Continue exercise to improve strength and stamina. Short: add 1-2 days a week of exercise at home on off days of rehab. Long: maintain independent exercise routine upon graduation from cardiac rehab.    Row Name 09/01/23 1333             Exercise Goal Re-Evaluation   Exercise  Goals Review Increase Physical Activity;Increase Strength and Stamina;Understanding of Exercise Prescription       Comments Rhett is doing well in rehab. He recently increased his speed on the treadmill to 3.6 mph while maintaining an incline of 5%. He also began using the XR at level 8 and improved to level 3 on the elliptical. We will continue to monitor his progress in the program.       Expected Outcomes Short: Continue to progressively increase treadmill workload. Long: Continue exercise to improve strength and stamina.          Discharge Exercise Prescription (Final Exercise Prescription Changes):  Exercise Prescription Changes - 09/01/23 1300       Response to Exercise   Blood Pressure (Admit) 122/78    Blood Pressure (Exit) 140/72    Heart Rate (Admit) 78 bpm    Heart Rate (Exercise) 142 bpm    Heart Rate (Exit) 112 bpm    Rating of Perceived Exertion (Exercise) 15    Symptoms none    Duration Continue with 30 min of aerobic exercise without signs/symptoms of physical distress.    Intensity THRR unchanged      Progression   Progression Continue to progress workloads to maintain  intensity without signs/symptoms of physical distress.    Average METs 5.16      Resistance Training   Training Prescription Yes    Weight 5 lb    Reps 10-15      Interval Training   Interval Training No      Treadmill   MPH 3.6    Grade 5    Minutes 15    METs 6.24      Elliptical   Level 3    Minutes 15    METs 3.7      REL-XR   Level 8    Minutes 15    METs 4.7      Home Exercise Plan   Plans to continue exercise at Home (comment)   walk and use hand weights   Frequency Add 2 additional days to program exercise sessions.    Initial Home Exercises Provided 08/26/23      Oxygen   Maintain Oxygen Saturation 88% or higher          Nutrition:  Target Goals: Understanding of nutrition guidelines, daily intake of sodium 1500mg , cholesterol 200mg , calories 30% from fat and 7%  or less from saturated fats, daily to have 5 or more servings of fruits and vegetables.  Education: Nutrition 1 -Group instruction provided by verbal, written material, interactive activities, discussions, models, and posters to present general guidelines for heart healthy nutrition including macronutrients, label reading, and promoting whole foods over processed counterparts. Education serves as P[employer] consultant of discussion of heart healthy eating for all. Written material provided at class time.    Education: Nutrition 2 -Group instruction provided by verbal, written material, interactive activities, discussions, models, and posters to present general guidelines for heart healthy nutrition including sodium, cholesterol, and saturated fat. Providing guidance of habit forming to improve blood pressure, cholesterol, and body weight. Written material provided at class time. Flowsheet Row Cardiac Rehab from 09/09/2023 in [facility] Cardiac and Pulmonary Rehab  Date 09/09/23  Educator jg  Instruction Review Code 1- Verbalizes Understanding      Biometrics:  Pre Biometrics - 07/13/23 1152       Pre Biometrics   Height 5' 11 (1.803 m)    Weight 228 lb 1.6 oz (103.5 kg)    Waist Circumference 42.5 inches    Hip Circumference 39 inches    Waist to Hip Ratio 1.09 %    BMI (Calculated) 31.83    Single Leg Stand 30 seconds           Nutrition Therapy Plan and Nutrition Goals:  Nutrition Therapy & Goals - 07/13/23 1336       Nutrition Therapy   Diet Cardiac, Low Na    Protein (specify units) 90    Fiber 30 grams    Whole Grain Foods 3 servings    Saturated Fats 15 max. grams    Fruits and Vegetables 5 servings/day    Sodium 2 grams      Personal Nutrition Goals   Nutrition Goal Eat 15-30gProtein and 30-60gCarbs at each meal.    Personal Goal #2 Read labels and reduce sodium intake to below 2300mg . Ideally 1500mg  per day.    Personal Goal #3 Reduce saturated fat, less than 12g per day.  Replace bad fats for more heart healthy fats.    Comments Patient drinking fruit juice and almond milk mostly. Reviewed his food recall, spoke with him about his high sugar intake and low nutrient intake. Educated on quality and quality of carbohydrates. Provided handout  on mediterranean diet. Educated on types of fats, sources, and how to read facts labels. Brainstormed several meal and snack ideas with foods he likes, focusing on building more balanced plates with more nutrient rich foods cutting back on added sugar.      Intervention Plan   Intervention Prescribe, educate and counsel regarding individualized specific dietary modifications aiming towards targeted core components such as weight, hypertension, lipid management, diabetes, heart failure and other comorbidities.;Nutrition handout(s) given to patient.    Expected Outcomes Short Term Goal: Understand basic principles of dietary content, such as calories, fat, sodium, cholesterol and nutrients.;Short Term Goal: A plan has been developed with personal nutrition goals set during dietitian appointment.;Long Term Goal: Adherence to prescribed nutrition plan.          Nutrition Assessments:  MEDIFICTS Score Key: >=70 Need to make dietary changes  40-70 Heart Healthy Diet <= 40 Therapeutic Level Cholesterol Diet  Flowsheet Row Cardiac Rehab from 07/13/2023 in [facility] Cardiac and Pulmonary Rehab  Picture Your Plate Total Score on Admission 68   Picture Your Plate Scores: <59 Unhealthy dietary pattern with much room for improvement. 41-50 Dietary pattern unlikely to meet recommendations for good health and room for improvement. 51-60 More healthful dietary pattern, with some room for improvement.  >60 Healthy dietary pattern, although there may be some specific behaviors that could be improved.    Nutrition Goals Re-Evaluation:  Nutrition Goals Re-Evaluation     Row Name 08/05/23 1411 09/09/23 1355           Goals   Comment Quantavious  reports that he does watch his sodium and has switched to diet sodas instead of regular. He has a protein drink at work and eats one main meal when he gets home. He tries to make sure that meal is balanced with carb, protein, and vegetables. He was encouraged to try to eat smaller more frequent meals but that is hard with his work schedule. Sultan is doing better with his nutrition. He is watching his sodium and drinking diet sodas more. He is attending nutrition education class today.      Expected Outcome Short: continue watching sodium and try to add smaller more frequent meals. Long: maintain heart healthy diet. STG: Attend nutrtion education class and continue to cut back/eliminate sugary beverages. LTG: maintain heart healthy diet.         Nutrition Goals Discharge (Final Nutrition Goals Re-Evaluation):  Nutrition Goals Re-Evaluation - 09/09/23 1355       Goals   Comment Yi is doing better with his nutrition. He is watching his sodium and drinking diet sodas more. He is attending nutrition education class today.    Expected Outcome STG: Attend nutrtion education class and continue to cut back/eliminate sugary beverages. LTG: maintain heart healthy diet.          Psychosocial: Target Goals: Acknowledge presence or absence of significant depression and/or stress, maximize coping skills, provide positive support system. Participant is able to verbalize types and ability to use techniques and skills needed for reducing stress and depression.   Education: Stress, Anxiety, and Depression - Group verbal and visual presentation to define topics covered.  Reviews how body is impacted by stress, anxiety, and depression.  Also discusses healthy ways to reduce stress and to treat/manage anxiety and depression. Written material provided at class time.   Education: Sleep Hygiene -Provides group verbal and written instruction about how sleep can affect your health.  Define sleep hygiene, discuss  sleep cycles and  impact of sleep habits. Review good sleep hygiene tips.   Initial Review & Psychosocial Screening:  Initial Psych Review & Screening - 07/08/23 1441       Initial Review   Current issues with Current Psychotropic Meds;Current Anxiety/Panic;Current Sleep Concerns      Family Dynamics   Good Support System? Yes    Comments Shaheer has some anxiety when he is in crowed areas when he is out. He was sleeping alot for awhile and now he is sleeping ok. He takes medication for his sleep and has been helping for the most part.      Barriers   Psychosocial barriers to participate in program The patient should benefit from training in stress management and relaxation.;There are no identifiable barriers or psychosocial needs.      Screening Interventions   Interventions Provide feedback about the scores to participant;To provide support and resources with identified psychosocial needs;Encouraged to exercise    Expected Outcomes Short Term goal: Utilizing psychosocial counselor, staff and physician to assist with identification of specific Stressors or current issues interfering with healing process. Setting desired goal for each stressor or current issue identified.;Long Term Goal: Stressors or current issues are controlled or eliminated.;Short Term goal: Identification and review with participant of any Quality of Life or Depression concerns found by scoring the questionnaire.;Long Term goal: The participant improves quality of Life and PHQ9 Scores as seen by post scores and/or verbalization of changes          Quality of Life Scores:   Quality of Life - 07/13/23 1153       Quality of Life   Select Quality of Life      Quality of Life Scores   Health/Function Pre 13.07 %    Socioeconomic Pre 12.88 %    Psych/Spiritual Pre 13.21 %    Family Pre 13.4 %    GLOBAL Pre 13.1 %         Scores of 19 and below usually indicate a poorer quality of life in these areas.  A  difference of  2-3 points is a clinically meaningful difference.  A difference of 2-3 points in the total score of the Quality of Life Index has been associated with significant improvement in overall quality of life, self-image, physical symptoms, and general health in studies assessing change in quality of life.  PHQ-9: Review Flowsheet       07/13/2023  Depression screen PHQ 2/9  Decreased Interest 0  Down, Depressed, Hopeless 0  PHQ - 2 Score 0  Altered sleeping 3  Tired, decreased energy 0  Change in appetite 0  Feeling bad or failure about yourself  0  Trouble concentrating 0  Moving slowly or fidgety/restless 0  Suicidal thoughts 0  PHQ-9 Score 3  Difficult doing work/chores Not difficult at all   Interpretation of Total Score  Total Score Depression Severity:  1-4 = Minimal depression, 5-9 = Mild depression, 10-14 = Moderate depression, 15-19 = Moderately severe depression, 20-27 = Severe depression   Psychosocial Evaluation and Intervention:  Psychosocial Evaluation - 07/08/23 1444       Psychosocial Evaluation & Interventions   Interventions Encouraged to exercise with the program and follow exercise prescription;Relaxation education;Stress management education    Comments Mukesh has some anxiety when he is in crowed areas when he is out. He was sleeping alot for awhile and now he is sleeping ok. He takes medication for his sleep and has been helping for the most part.  Expected Outcomes Short: Start [facility] to help with mood. Long: Maintain a healthy mental state    Continue Psychosocial Services  Follow up required by staff          Psychosocial Re-Evaluation:  Psychosocial Re-Evaluation     Row Name 08/05/23 1422 09/09/23 1357           Psychosocial Re-Evaluation   Current issues with Current Psychotropic Meds;Current Anxiety/Panic;Current Sleep Concerns Current Psychotropic Meds;Current Anxiety/Panic;Current Sleep Concerns      Comments Clee stated  that he does not sleep well but that is nothing new and there have been no changes recently in sleep patterns. He did start back to work which has caused stress levels to increase some but he states he is mostly able to manage his stress levels. He continue to take meds for his mood and to control anxiety and he reports that they are working and all of that has been stable. Tarry states he still does not sleep well and there has been no changes in a long time with his sleep patterns. He has talked to his doctor about his concerns and has tried different sleep aids over the years. He states he does have work stress but from the most part he is able to deal with it. He does report having  support system and the resources he needs to manage his mental health. He states that the exercise he is doing during cardiac rehab does help his mental state.      Expected Outcomes Short: continue to adjust to being back to work and managing that new stress. Long: continue to exercise for the mental health benefits of exericse. Short: continue to attend cardiac rehab for the mental health benefits of exercise. Long: manage stress levels and maintain exercise routine upon graduation to help with stress.      Interventions Encouraged to attend Cardiac Rehabilitation for the exercise Encouraged to attend Cardiac Rehabilitation for the exercise      Continue Psychosocial Services  Follow up required by staff Follow up required by staff         Psychosocial Discharge (Final Psychosocial Re-Evaluation):  Psychosocial Re-Evaluation - 09/09/23 1357       Psychosocial Re-Evaluation   Current issues with Current Psychotropic Meds;Current Anxiety/Panic;Current Sleep Concerns    Comments Vi states he still does not sleep well and there has been no changes in a long time with his sleep patterns. He has talked to his doctor about his concerns and has tried different sleep aids over the years. He states he does have work stress  but from the most part he is able to deal with it. He does report having  support system and the resources he needs to manage his mental health. He states that the exercise he is doing during cardiac rehab does help his mental state.    Expected Outcomes Short: continue to attend cardiac rehab for the mental health benefits of exercise. Long: manage stress levels and maintain exercise routine upon graduation to help with stress.    Interventions Encouraged to attend Cardiac Rehabilitation for the exercise    Continue Psychosocial Services  Follow up required by staff          Vocational Rehabilitation: Provide vocational rehab assistance to qualifying candidates.   Vocational Rehab Evaluation & Intervention:   Education: Education Goals: Education classes will be provided on a variety of topics geared toward better understanding of heart health and risk factor modification. Participant will state understanding/return  demonstration of topics presented as noted by education test scores.  Learning Barriers/Preferences:  Learning Barriers/Preferences - 07/08/23 1438       Learning Barriers/Preferences   Learning Barriers None    Learning Preferences None          General Cardiac Education Topics:  AED/CPR: - Group verbal and written instruction with the use of models to demonstrate the basic use of the AED with the basic ABC's of resuscitation.   Test and Procedures: - Group verbal and visual presentation and models provide information about basic cardiac anatomy and function. Reviews the testing methods done to diagnose heart disease and the outcomes of the test results. Describes the treatment choices: Medical Management, Angioplasty, or Coronary Bypass Surgery for treating various heart conditions including Myocardial Infarction, Angina, Valve Disease, and Cardiac Arrhythmias. Written material provided at class time. Flowsheet Row Cardiac Rehab from 09/09/2023 in [facility] Cardiac and  Pulmonary Rehab  Education need identified 07/13/23    Medication Safety: - Group verbal and visual instruction to review commonly prescribed medications for heart and lung disease. Reviews the medication, class of the drug, and side effects. Includes the steps to properly store meds and maintain the prescription regimen. Written material provided at class time.   Intimacy: - Group verbal instruction through game format to discuss how heart and lung disease can affect sexual intimacy. Written material provided at class time. Flowsheet Row Cardiac Rehab from 09/09/2023 in [facility] Cardiac and Pulmonary Rehab  Date 08/26/23  Educator [facility]  Instruction Review Code 1- Verbalizes Understanding    Know Your Numbers and Heart Failure: - Group verbal and visual instruction to discuss disease risk factors for cardiac and pulmonary disease and treatment options.  Reviews associated critical values for Overweight/Obesity, Hypertension, Cholesterol, and Diabetes.  Discusses basics of heart failure: signs/symptoms and treatments.  Introduces Heart Failure Zone chart for action plan for heart failure. Written material provided at class time. Flowsheet Row Cardiac Rehab from 09/09/2023 in [facility] Cardiac and Pulmonary Rehab  Date 07/22/23  Educator sb  Instruction Review Code 1- Verbalizes Understanding    Infection Prevention: - Provides verbal and written material to individual with discussion of infection control including proper hand washing and proper equipment cleaning during exercise session. Flowsheet Row Cardiac Rehab from 09/09/2023 in [facility] Cardiac and Pulmonary Rehab  Date 07/13/23  Educator [facility]  Instruction Review Code 1- Verbalizes Understanding    Falls Prevention: - Provides verbal and written material to individual with discussion of falls prevention and safety. Flowsheet Row Cardiac Rehab from 09/09/2023 in [facility] Cardiac and Pulmonary Rehab  Date 07/13/23  Educator [facility]  Instruction Review Code  1- Verbalizes Understanding    Other: -Provides group and verbal instruction on various topics (see comments)   Knowledge Questionnaire Score:  Knowledge Questionnaire Score - 07/13/23 1154       Knowledge Questionnaire Score   Pre Score 23/26          Core Components/Risk Factors/Patient Goals at Admission:  Personal Goals and Risk Factors at Admission - 07/08/23 1437       Core Components/Risk Factors/Patient Goals on Admission    Weight Management Yes;Weight Loss    Intervention Weight Management: Develop a combined nutrition and exercise program designed to reach desired caloric intake, while maintaining appropriate intake of nutrient and fiber, sodium and fats, and appropriate energy expenditure required for the weight goal.;Weight Management: Provide education and appropriate resources to help participant work on and attain dietary goals.;Obesity: Provide education and appropriate resources  to help participant work on and attain dietary goals.;Weight Management/Obesity: Establish reasonable short term and long term weight goals.    Expected Outcomes Short Term: Continue to assess and modify interventions until short term weight is achieved;Weight Loss: Understanding of general recommendations for a balanced deficit meal plan, which promotes 1-2 lb weight loss per week and includes a negative energy balance of [id number] kcal/d;Understanding recommendations for meals to include 15-35% energy as protein, 25-35% energy from fat, 35-60% energy from carbohydrates, less than 200mg  of dietary cholesterol, 20-35 gm of total fiber daily;Understanding of distribution of calorie intake throughout the day with the consumption of 4-5 meals/snacks    Tobacco Cessation Yes    Number of packs per day 0    Intervention Assist the participant in steps to quit. Provide individualized education and counseling about committing to Tobacco Cessation, relapse prevention, and pharmacological support that can  be provided by physician.;Education officer, environmental, assist with locating and accessing local/national Quit Smoking programs, and support quit date choice.    Expected Outcomes Short Term: Will demonstrate readiness to quit, by selecting a quit date.;Long Term: Complete abstinence from all tobacco products for at least 12 months from quit date.;Short Term: Will quit all tobacco product use, adhering to prevention of relapse plan.    Diabetes Yes    Intervention Provide education about signs/symptoms and action to take for hypo/hyperglycemia.;Provide education about proper nutrition, including hydration, and aerobic/resistive exercise prescription along with prescribed medications to achieve blood glucose in normal ranges: Fasting glucose 65-99 mg/dL    Expected Outcomes Long Term: Attainment of HbA1C < 7%.;Short Term: Participant verbalizes understanding of the signs/symptoms and immediate care of hyper/hypoglycemia, proper foot care and importance of medication, aerobic/resistive exercise and nutrition plan for blood glucose control.    Hypertension Yes    Intervention Provide education on lifestyle modifcations including regular physical activity/exercise, weight management, moderate sodium restriction and increased consumption of fresh fruit, vegetables, and low fat dairy, alcohol moderation, and smoking cessation.;Monitor prescription use compliance.    Expected Outcomes Short Term: Continued assessment and intervention until BP is < 140/35mm HG in hypertensive participants. < 130/76mm HG in hypertensive participants with diabetes, heart failure or chronic kidney disease.;Long Term: Maintenance of blood pressure at goal levels.          Education:Diabetes - Individual verbal and written instruction to review signs/symptoms of diabetes, desired ranges of glucose level fasting, after meals and with exercise. Acknowledge that pre and post exercise glucose checks will be done for 3 sessions at  entry of program. Flowsheet Row Cardiac Rehab from 09/09/2023 in [facility] Cardiac and Pulmonary Rehab  Date 07/13/23  Educator [facility]  Instruction Review Code 1- Verbalizes Understanding    Core Components/Risk Factors/Patient Goals Review:   Goals and Risk Factor Review     Row Name 08/05/23 1418 09/09/23 1406           Core Components/Risk Factors/Patient Goals Review   Personal Goals Review Hypertension;Diabetes Weight Management/Obesity;Tobacco Cessation;Hypertension      Review Patient was asked about monitoring blood glucose and he stated no one ever told him to check his blood sugars at home and he doesn't have a monitor. He was encouraged to aske his doctor about that and see if he can get a blood glucose monitor for home if his doctor wants him to be checking blood sugars. He does take his DM meds regularly and follows up with doctor for all lab work and appointments. He does have a blood pressure  cuff and was encouraged to start checking blood pressure at home. Torion reports that he still has not gotten in the habbit of checking BP at home. He was encouraged to do so. He reports he weight has been mostly steady but he has lost weight slow and steady over the last several years. He reports still using dip but that he has been cutting back on the amount he dips. He does not have a quit date set but just keeps trying to cut back. His inurance company asked him if he wanted them to send him a blood glucose meter and he agreed but has not yet recieved it.      Expected Outcomes Short: talk to doctor about getting a blood glucose meter. start checking blood pressure at home. Long: control cardiac risk factors. Short: follow up about recieving a blood glucose meter. Continue to cut back on dipping. Long: become become independent with monitoring blood pressure and blood sugar. Become tobacco free.         Core Components/Risk Factors/Patient Goals at Discharge (Final Review):   Goals and Risk Factor  Review - 09/09/23 1406       Core Components/Risk Factors/Patient Goals Review   Personal Goals Review Weight Management/Obesity;Tobacco Cessation;Hypertension    Review Ronen reports that he still has not gotten in the habbit of checking BP at home. He was encouraged to do so. He reports he weight has been mostly steady but he has lost weight slow and steady over the last several years. He reports still using dip but that he has been cutting back on the amount he dips. He does not have a quit date set but just keeps trying to cut back. His inurance company asked him if he wanted them to send him a blood glucose meter and he agreed but has not yet recieved it.    Expected Outcomes Short: follow up about recieving a blood glucose meter. Continue to cut back on dipping. Long: become become independent with monitoring blood pressure and blood sugar. Become tobacco free.          ITP Comments:  ITP Comments     Row Name 07/08/23 1440 07/13/23 1145 07/17/23 1051 07/22/23 0943 08/19/23 0824   ITP Comments Virtual Visit completed. Patient informed on EP and RD appointment and 6 Minute walk test. Patient also informed of patient health questionnaires on My Chart. Patient Verbalizes understanding. Visit diagnosis can be found in [facility] 06/01/20[facility]. Completed and gym orientation for cardiac rehab. Initial ITP created and sent for review to Dr. Oneil Pinal, Medical Director. First full day of exercise!  Patient was oriented to gym and equipment including functions, settings, policies, and procedures.  Patient's individual exercise prescription and treatment plan were reviewed.  All starting workloads were established based on the results of the 6 minute walk test done at initial orientation visit.  The plan for exercise progression was also introduced and progression will be customized based on patient's performance and goals. 30 Day review completed. Medical Director ITP review done, changes made as directed,  and signed approval by Medical Director. new to program 30 Day review completed. Medical Director ITP review done, changes made as directed, and signed approval by Medical Director.    Row Name 09/16/23 0901           ITP Comments 30 Day review completed. Medical Director ITP review done, changes made as directed, and signed approval by Medical Director.  Comments: 30 day review

## 2023-09-17 ENCOUNTER — Encounter

## 2023-09-18 ENCOUNTER — Encounter

## 2023-09-23 ENCOUNTER — Encounter: Attending: Internal Medicine | Admitting: Emergency Medicine

## 2023-09-23 ENCOUNTER — Encounter

## 2023-09-23 DIAGNOSIS — Z48812 Encounter for surgical aftercare following surgery on the circulatory system: Secondary | ICD-10-CM | POA: Insufficient documentation

## 2023-09-23 DIAGNOSIS — Z95828 Presence of other vascular implants and grafts: Secondary | ICD-10-CM | POA: Diagnosis present

## 2023-09-23 DIAGNOSIS — Z952 Presence of prosthetic heart valve: Secondary | ICD-10-CM | POA: Insufficient documentation

## 2023-09-23 NOTE — Progress Notes (Signed)
 Daily Session Note  Patient Details  Name: Ryker Sudbury MRN: [id number] Date of Birth: [birth year] Referring Provider:   Flowsheet Row Cardiac Rehab from 07/13/2023 in [facility] Cardiac and Pulmonary Rehab  Referring Provider Dr. Georgine Mc    Encounter Date: 09/23/2023  Check In:  Session Check In - 09/23/23 1359       Check-In   Supervising physician immediately available to respond to emergencies See telemetry face sheet for immediately available ER MD    Location [facility]-Cardiac & Pulmonary Rehab    Staff Present Devaughn Jaeger, BS, Exercise Physiologist;Maxon Conetta BS, Exercise Physiologist;Charlee Squibb RN,BSN;Meredith Tressa RN,BSN    Virtual Visit No    Medication changes reported     Yes    Comments increased warfarin to 15mg  4x/week    Fall or balance concerns reported    No    Tobacco Cessation No Change    Warm-up and Cool-down Performed on first and last piece of equipment    Resistance Training Performed Yes    VAD Patient? No    PAD/SET Patient? No      Pain Assessment   Currently in Pain? No/denies             Social History   Tobacco Use  Smoking Status Never  Smokeless Tobacco Current   Types: Snuff    Goals Met:  Independence with exercise equipment Exercise tolerated well No report of concerns or symptoms today Strength training completed today  Goals Unmet:  Not Applicable  Comments: Pt able to follow exercise prescription today without complaint.  Will continue to monitor for progression.    Dr. Oneil Pinal is Medical Director for [facility] Cardiac Rehabilitation.  Dr. Fuad Aleskerov is Medical Director for [facility] Pulmonary Rehabilitation.

## 2023-09-24 ENCOUNTER — Encounter: Admitting: Emergency Medicine

## 2023-09-24 DIAGNOSIS — Z952 Presence of prosthetic heart valve: Secondary | ICD-10-CM

## 2023-09-24 DIAGNOSIS — Z48812 Encounter for surgical aftercare following surgery on the circulatory system: Secondary | ICD-10-CM | POA: Diagnosis not present

## 2023-09-24 NOTE — Progress Notes (Signed)
 Daily Session Note  Patient Details  Name: Daniel Crosby MRN: [id number] Date of Birth: [birth year] Referring Provider:   Flowsheet Row Cardiac Rehab from 07/13/2023 in [facility] Cardiac and Pulmonary Rehab  Referring Provider Dr. Georgine Mc    Encounter Date: 09/24/2023  Check In:  Session Check In - 09/24/23 1341       Check-In   Supervising physician immediately available to respond to emergencies See telemetry face sheet for immediately available ER MD    Location [facility]-Cardiac & Pulmonary Rehab    Staff Present Devaughn Jaeger, BS, Exercise Physiologist;Joseph [facility] RN,BSN;Margaret Best, MS, Exercise Physiologist    Virtual Visit No    Medication changes reported     No    Fall or balance concerns reported    No    Tobacco Cessation No Change    Warm-up and Cool-down Performed on first and last piece of equipment    Resistance Training Performed Yes    VAD Patient? No    PAD/SET Patient? No      Pain Assessment   Currently in Pain? No/denies             Social History   Tobacco Use  Smoking Status Never  Smokeless Tobacco Current   Types: Snuff    Goals Met:  Independence with exercise equipment Exercise tolerated well No report of concerns or symptoms today Strength training completed today  Goals Unmet:  Not Applicable  Comments: Pt able to follow exercise prescription today without complaint.  Will continue to monitor for progression.    Dr. Oneil Pinal is Medical Director for [facility] Cardiac Rehabilitation.  Dr. Fuad Aleskerov is Medical Director for [facility] Pulmonary Rehabilitation.

## 2023-09-25 ENCOUNTER — Encounter

## 2023-09-28 ENCOUNTER — Encounter

## 2023-09-28 ENCOUNTER — Encounter: Admitting: Emergency Medicine

## 2023-09-28 DIAGNOSIS — Z48812 Encounter for surgical aftercare following surgery on the circulatory system: Secondary | ICD-10-CM | POA: Diagnosis not present

## 2023-09-28 DIAGNOSIS — Z952 Presence of prosthetic heart valve: Secondary | ICD-10-CM

## 2023-09-28 NOTE — Progress Notes (Signed)
 Daily Session Note  Patient Details  Name: Beaux Wedemeyer MRN: [id number] Date of Birth: [birth year] Referring Provider:   Flowsheet Row Cardiac Rehab from 07/13/2023 in [facility] Cardiac and Pulmonary Rehab  Referring Provider Dr. Georgine Mc    Encounter Date: 09/28/2023  Check In:  Session Check In - 09/28/23 1818       Check-In   Supervising physician immediately available to respond to emergencies See telemetry face sheet for immediately available ER MD    Location [facility]-Cardiac & Pulmonary Rehab    Staff Present Devaughn Jaeger, BS, Exercise Physiologist;Maxon Conetta BS, Exercise Physiologist;Atharv Barriere RN,BSN    Virtual Visit No    Medication changes reported     No    Fall or balance concerns reported    No    Tobacco Cessation No Change    Warm-up and Cool-down Performed on first and last piece of equipment    Resistance Training Performed Yes    VAD Patient? No    PAD/SET Patient? No      Pain Assessment   Currently in Pain? No/denies             Social History   Tobacco Use  Smoking Status Never  Smokeless Tobacco Current   Types: Snuff    Goals Met:  Independence with exercise equipment Exercise tolerated well No report of concerns or symptoms today Strength training completed today  Goals Unmet:  Not Applicable  Comments: Pt able to follow exercise prescription today without complaint.  Will continue to monitor for progression.    Dr. Oneil Pinal is Medical Director for [facility] Cardiac Rehabilitation.  Dr. Fuad Aleskerov is Medical Director for [facility] Pulmonary Rehabilitation.

## 2023-09-30 ENCOUNTER — Encounter

## 2023-09-30 VITALS — Ht 71.0 in | Wt 229.2 lb

## 2023-09-30 DIAGNOSIS — Z48812 Encounter for surgical aftercare following surgery on the circulatory system: Secondary | ICD-10-CM | POA: Diagnosis not present

## 2023-09-30 DIAGNOSIS — Z952 Presence of prosthetic heart valve: Secondary | ICD-10-CM

## 2023-09-30 NOTE — Patient Instructions (Signed)
 Discharge Patient Instructions  Patient Details  Name: Daniel Crosby MRN: [id number] Date of Birth: July 12, 1960 Referring Provider:  Elnor Fairy HERO, NP   Number of Visits: 36  Reason for Discharge:  Patient reached a stable level of exercise. Patient independent in their exercise. Patient has met program and personal goals.  Smoking History:  Social History   Tobacco Use  Smoking Status Never  Smokeless Tobacco Current   Types: Snuff    Diagnosis:  S/P TAVR (transcatheter aortic valve replacement)  Initial Exercise Prescription:  Initial Exercise Prescription - 07/13/23 1100       Date of Initial Exercise RX and Referring Provider   Date 07/13/23    Referring Provider Dr. Georgine Alemu      Oxygen   Maintain Oxygen Saturation 88% or higher      Treadmill   MPH 3    Grade 2    Minutes 15    METs 4.12      Elliptical   Level 1    Speed 3    Minutes 15    METs 3.96      Rower   Level 3    Watts 25    Minutes 15    METs 3.96      Prescription Details   Duration Progress to 30 minutes of continuous aerobic without signs/symptoms of physical distress      Intensity   THRR 40-80% of Max Heartrate 124-147    Ratings of Perceived Exertion 11-13    Perceived Dyspnea 0-4      Progression   Progression Continue to progress workloads to maintain intensity without signs/symptoms of physical distress.      Resistance Training   Training Prescription Yes    Weight 5lb    Reps 10-15          Discharge Exercise Prescription (Final Exercise Prescription Changes):  Exercise Prescription Changes - 09/29/23 1400       Response to Exercise   Blood Pressure (Admit) 110/68    Blood Pressure (Exit) 124/74    Heart Rate (Admit) 87 bpm    Heart Rate (Exercise) 127 bpm    Heart Rate (Exit) 91 bpm    Rating of Perceived Exertion (Exercise) 14    Symptoms none    Duration Continue with 30 min of aerobic exercise without signs/symptoms of physical distress.     Intensity THRR unchanged      Progression   Progression Continue to progress workloads to maintain intensity without signs/symptoms of physical distress.    Average METs 4.5      Resistance Training   Training Prescription Yes    Weight 5 lb    Reps 10-15      Interval Training   Interval Training No      Treadmill   MPH 3.5    Grade 4.5    Minutes 15    METs 5.85      Elliptical   Level 4    Speed 6    Minutes 15    METs 3.8      Home Exercise Plan   Plans to continue exercise at Home (comment)   walk and use hand weights   Frequency Add 2 additional days to program exercise sessions.    Initial Home Exercises Provided 08/26/23      Oxygen   Maintain Oxygen Saturation 88% or higher          Functional Capacity:  6 Minute Walk     Row  Name 07/13/23 1145 09/30/23 1414       6 Minute Walk   Phase Initial Discharge    Distance 1555 feet 2060 feet    Distance % Change -- 32 %    Distance Feet Change -- 505 ft    Walk Time 6 minutes 6 minutes    # of Rest Breaks 0 0    MPH 2.9 3.9    METS 3.96 4.97    RPE 9 9    Perceived Dyspnea  0 0    VO2 Peak 13.87 17.41    Symptoms No No    Resting HR 102 bpm 97 bpm    Resting BP 128/72 122/80    Resting Oxygen Saturation  95 % --    Exercise Oxygen Saturation  during 6 min walk 97 % --    Max Ex. HR 114 bpm 123 bpm    Max Ex. BP 156/74 160/88    2 Minute Post BP 134/72 140/80       Nutrition & Weight - Outcomes:  Pre Biometrics - 07/13/23 1152       Pre Biometrics   Height 5' 11 (1.803 m)    Weight 228 lb 1.6 oz (103.5 kg)    Waist Circumference 42.5 inches    Hip Circumference 39 inches    Waist to Hip Ratio 1.09 %    BMI (Calculated) 31.83    Single Leg Stand 30 seconds          Post Biometrics - 09/30/23 1416        Post  Biometrics   Height 5' 11 (1.803 m)    Weight 229 lb 3.2 oz (104 kg)    Waist Circumference 43 inches    Hip Circumference 42 inches    Waist to Hip Ratio 1.02 %     BMI (Calculated) 31.98    Single Leg Stand 30 seconds          Nutrition:  Nutrition Therapy & Goals - 07/13/23 1336       Nutrition Therapy   Diet Cardiac, Low Na    Protein (specify units) 90    Fiber 30 grams    Whole Grain Foods 3 servings    Saturated Fats 15 max. grams    Fruits and Vegetables 5 servings/day    Sodium 2 grams      Personal Nutrition Goals   Nutrition Goal Eat 15-30gProtein and 30-60gCarbs at each meal.    Personal Goal #2 Read labels and reduce sodium intake to below 2300mg . Ideally 1500mg  per day.    Personal Goal #3 Reduce saturated fat, less than 12g per day. Replace bad fats for more heart healthy fats.    Comments Patient drinking fruit juice and almond milk mostly. Reviewed his food recall, spoke with him about his high sugar intake and low nutrient intake. Educated on quality and quality of carbohydrates. Provided handout on mediterranean diet. Educated on types of fats, sources, and how to read facts labels. Brainstormed several meal and snack ideas with foods he likes, focusing on building more balanced plates with more nutrient rich foods cutting back on added sugar.      Intervention Plan   Intervention Prescribe, educate and counsel regarding individualized specific dietary modifications aiming towards targeted core components such as weight, hypertension, lipid management, diabetes, heart failure and other comorbidities.;Nutrition handout(s) given to patient.    Expected Outcomes Short Term Goal: Understand basic principles of dietary content, such as calories, fat, sodium, cholesterol  and nutrients.;Short Term Goal: A plan has been developed with personal nutrition goals set during dietitian appointment.;Long Term Goal: Adherence to prescribed nutrition plan.          Nutrition Discharge:   Education Questionnaire Score:  Knowledge Questionnaire Score - 07/13/23 1154       Knowledge Questionnaire Score   Pre Score 23/26           Goals reviewed with patient; copy given to patient.

## 2023-09-30 NOTE — Progress Notes (Signed)
 Daily Session Note  Patient Details  Name: Daniel Crosby MRN: [id number] Date of Birth: [birth year] Referring Provider:   Flowsheet Row Cardiac Rehab from 07/13/2023 in [facility] Cardiac and Pulmonary Rehab  Referring Provider Dr. Georgine Mc    Encounter Date: 09/30/2023  Check In:  Session Check In - 09/30/23 1356       Check-In   Supervising physician immediately available to respond to emergencies See telemetry face sheet for immediately available ER MD    Location [facility]-Cardiac & Pulmonary Rehab    Staff Present Burnard Davenport RN,BSN,MPA;Laura Cates RN,BSN;Noah Laird, BS, Exercise Physiologist;Haruki Arnold Dyane HECKLE, ACSM CEP, Exercise Physiologist    Virtual Visit No    Medication changes reported     No    Fall or balance concerns reported    No    Tobacco Cessation No Change    Warm-up and Cool-down Performed on first and last piece of equipment    Resistance Training Performed Yes    VAD Patient? No    PAD/SET Patient? No      Pain Assessment   Currently in Pain? No/denies             Social History   Tobacco Use  Smoking Status Never  Smokeless Tobacco Current   Types: Snuff    Goals Met:  Independence with exercise equipment Exercise tolerated well No report of concerns or symptoms today Strength training completed today  Goals Unmet:  Not Applicable  Comments: Pt able to follow exercise prescription today without complaint.  Will continue to monitor for progression.   6 Minute Walk     Row Name 07/13/23 1145 09/30/23 1414       6 Minute Walk   Phase Initial Discharge    Distance 1555 feet 2060 feet    Distance % Change -- 32 %    Distance Feet Change -- 505 ft    Walk Time 6 minutes 6 minutes    # of Rest Breaks 0 0    MPH 2.9 3.9    METS 3.96 4.97    RPE 9 9    Perceived Dyspnea  0 0    VO2 Peak 13.87 17.41    Symptoms No No    Resting HR 102 bpm 97 bpm    Resting BP 128/72 122/80    Resting Oxygen Saturation  95 % --    Exercise Oxygen  Saturation  during 6 min walk 97 % --    Max Ex. HR 114 bpm 123 bpm    Max Ex. BP 156/74 160/88    2 Minute Post BP 134/72 140/80          Dr. Oneil Pinal is Medical Director for [facility] Cardiac Rehabilitation.  Dr. Fuad Aleskerov is Medical Director for [facility] Pulmonary Rehabilitation.

## 2023-10-01 ENCOUNTER — Encounter: Admitting: Emergency Medicine

## 2023-10-01 DIAGNOSIS — Z952 Presence of prosthetic heart valve: Secondary | ICD-10-CM

## 2023-10-01 DIAGNOSIS — Z48812 Encounter for surgical aftercare following surgery on the circulatory system: Secondary | ICD-10-CM | POA: Diagnosis not present

## 2023-10-01 NOTE — Progress Notes (Signed)
 Daily Session Note  Patient Details  Name: Leanthony Rhett MRN: [id number] Date of Birth: 17-May-1960 Referring Provider:   Flowsheet Row Cardiac Rehab from 07/13/2023 in [facility] Cardiac and Pulmonary Rehab  Referring Provider Dr. Georgine Mc    Encounter Date: 10/01/2023  Check In:  Session Check In - 10/01/23 1343       Check-In   Supervising physician immediately available to respond to emergencies See telemetry face sheet for immediately available ER MD    Location [facility]-Cardiac & Pulmonary Rehab    Staff Present Devaughn Jaeger, BS, Exercise Physiologist;[facility] RN,BSN;Margaret Best, MS, Exercise Physiologist    Virtual Visit No    Medication changes reported     No    Fall or balance concerns reported    No    Tobacco Cessation No Change    Warm-up and Cool-down Performed on first and last piece of equipment    Resistance Training Performed Yes    VAD Patient? No    PAD/SET Patient? No      Pain Assessment   Currently in Pain? No/denies             Social History   Tobacco Use  Smoking Status Never  Smokeless Tobacco Current   Types: Snuff    Goals Met:  Independence with exercise equipment Exercise tolerated well No report of concerns or symptoms today Strength training completed today  Goals Unmet:  Not Applicable  Comments: Pt able to follow exercise prescription today without complaint.  Will continue to monitor for progression.    Dr. Oneil Pinal is Medical Director for [facility] Cardiac Rehabilitation.  Dr. Fuad Aleskerov is Medical Director for [facility] Pulmonary Rehabilitation.

## 2023-10-02 ENCOUNTER — Encounter

## 2023-10-05 ENCOUNTER — Encounter

## 2023-10-05 ENCOUNTER — Encounter: Admitting: Emergency Medicine

## 2023-10-05 DIAGNOSIS — Z48812 Encounter for surgical aftercare following surgery on the circulatory system: Secondary | ICD-10-CM | POA: Diagnosis not present

## 2023-10-05 DIAGNOSIS — Z952 Presence of prosthetic heart valve: Secondary | ICD-10-CM

## 2023-10-05 NOTE — Progress Notes (Signed)
 Daily Session Note  Patient Details  Name: Daniel Crosby MRN: [id number] Date of Birth: 1960/06/16 Referring Provider:   Flowsheet Row Cardiac Rehab from 07/13/2023 in [facility] Cardiac and Pulmonary Rehab  Referring Provider Dr. Georgine Mc    Encounter Date: 10/05/2023  Check In:  Session Check In - 10/05/23 1401       Check-In   Supervising physician immediately available to respond to emergencies See telemetry face sheet for immediately available ER MD    Location [facility]-Cardiac & Pulmonary Rehab    Staff Present Devaughn Jaeger, BS, Exercise Physiologist;Margaret Best, MS, Exercise Physiologist;Enma Maeda RN,BSN;Meredith Tressa RN,BSN    Virtual Visit No    Medication changes reported     No    Fall or balance concerns reported    No    Tobacco Cessation No Change    Warm-up and Cool-down Performed on first and last piece of equipment    Resistance Training Performed Yes    VAD Patient? No    PAD/SET Patient? No      Pain Assessment   Currently in Pain? No/denies             Social History   Tobacco Use  Smoking Status Never  Smokeless Tobacco Current   Types: Snuff    Goals Met:  Independence with exercise equipment Exercise tolerated well No report of concerns or symptoms today Strength training completed today  Goals Unmet:  Not Applicable  Comments: Pt able to follow exercise prescription today without complaint.  Will continue to monitor for progression.    Dr. Oneil Pinal is Medical Director for [facility] Cardiac Rehabilitation.  Dr. Fuad Aleskerov is Medical Director for [facility] Pulmonary Rehabilitation.

## 2023-10-07 ENCOUNTER — Encounter

## 2023-10-08 ENCOUNTER — Encounter

## 2023-10-09 ENCOUNTER — Encounter

## 2023-10-12 ENCOUNTER — Encounter

## 2023-10-12 ENCOUNTER — Ambulatory Visit

## 2023-10-14 ENCOUNTER — Encounter

## 2023-10-14 ENCOUNTER — Encounter: Payer: Self-pay | Admitting: *Deleted

## 2023-10-14 DIAGNOSIS — Z952 Presence of prosthetic heart valve: Secondary | ICD-10-CM

## 2023-10-14 DIAGNOSIS — Z48812 Encounter for surgical aftercare following surgery on the circulatory system: Secondary | ICD-10-CM | POA: Diagnosis not present

## 2023-10-14 NOTE — Progress Notes (Signed)
 Cardiac Individual Treatment Plan  Patient Details  Name: Alisha Burgo MRN: [id number] Date of Birth: 1960/10/13 Referring Provider:   Flowsheet Row Cardiac Rehab from 07/13/2023 in [facility] Cardiac and Pulmonary Rehab  Referring Provider Dr. Georgine Mc    Initial Encounter Date:  Flowsheet Row Cardiac Rehab from 07/13/2023 in [facility] Cardiac and Pulmonary Rehab  Date 07/13/23    Visit Diagnosis: S/P TAVR (transcatheter aortic valve replacement)  Patient's Home Medications on Admission:  Current Outpatient Medications:    amLODipine (NORVASC) 10 MG tablet, Take 10 mg by mouth daily., Disp: , Rfl:    aspirin EC 81 MG tablet, Take 81 mg by mouth., Disp: , Rfl:    citalopram (CELEXA) 40 MG tablet, Take 40 mg by mouth., Disp: , Rfl:    DULoxetine (CYMBALTA) 60 MG capsule, Take 60 mg by mouth., Disp: , Rfl:    famotidine (PEPCID) 20 MG tablet, Take 20 mg by mouth 2 (two) times daily., Disp: , Rfl:    fluticasone (FLONASE) 50 MCG/ACT nasal spray, 2 sprays., Disp: , Rfl:    furosemide (LASIX) 40 MG tablet, Take 40 mg by mouth., Disp: , Rfl:    lisinopril (ZESTRIL) 40 MG tablet, Take 40 mg by mouth daily., Disp: , Rfl:    metFORMIN (GLUCOPHAGE) 500 MG tablet, TAKE 1 TABLET WITH         BREAKFAST, Disp: , Rfl:    naproxen  (NAPROSYN ) 500 MG tablet, Take 1 tablet (500 mg total) by mouth 2 (two) times daily with a meal., Disp: 14 tablet, Rfl: 0   oxyCODONE (OXY IR/ROXICODONE) 5 MG immediate release tablet, Take by mouth. (Patient not taking: Reported on 07/08/2023), Disp: , Rfl:    potassium chloride SA (KLOR-CON M) 20 MEQ tablet, Take 20 mEq by mouth., Disp: , Rfl:    pravastatin (PRAVACHOL) 40 MG tablet, SMARTSIG:1 Tablet(s) By Mouth Every Evening, Disp: , Rfl:    pravastatin (PRAVACHOL) 80 MG tablet, Take 80 mg by mouth., Disp: , Rfl:    Probiotic Product (PROBIOTIC DAILY) CAPS, Take 1 tablet by mouth every evening., Disp: , Rfl:    traZODone (DESYREL) 50 MG tablet, Take 50 mg by mouth., Disp: , Rfl:     warfarin (COUMADIN) 10 MG tablet, Take 10 mg by mouth., Disp: , Rfl:   Past Medical History: No past medical history on file.  Tobacco Use: Social History   Tobacco Use  Smoking Status Never  Smokeless Tobacco Current   Types: Snuff    Labs: Review Flowsheet        No data to display           Exercise Target Goals: Exercise Program Goal: Individual exercise prescription set using results from initial 6 min walk test and THRR while considering  patient's activity barriers and safety.   Exercise Prescription Goal: Initial exercise prescription builds to 30-45 minutes a day of aerobic activity, 2-3 days per week.  Home exercise guidelines will be given to patient during program as part of exercise prescription that the participant will acknowledge.   Education: Aerobic Exercise: - Group verbal and visual presentation on the components of exercise prescription. Introduces F.I.T.T principle from ACSM for exercise prescriptions.  Reviews F.I.T.T. principles of aerobic exercise including progression. Written material provided at class time. Flowsheet Row Cardiac Rehab from 09/30/2023 in [facility] Cardiac and Pulmonary Rehab  Education need identified 07/13/23  Date 08/26/23  Educator [facility]  Instruction Review Code 1- [employer] Understanding    Education: Resistance Exercise: - Group verbal and  visual presentation on the components of exercise prescription. Introduces F.I.T.T principle from ACSM for exercise prescriptions  Reviews F.I.T.T. principles of resistance exercise including progression. Written material provided at class time.    Education: Exercise & Equipment Safety: - Individual verbal instruction and demonstration of equipment use and safety with use of the equipment. Flowsheet Row Cardiac Rehab from 09/30/2023 in [facility] Cardiac and Pulmonary Rehab  Date 07/13/23  Educator [facility]  Instruction Review Code 1- Verbalizes Understanding    Education: Exercise Physiology &  General Exercise Guidelines: - Group verbal and written instruction with models to review the exercise physiology of the cardiovascular system and associated critical values. Provides general exercise guidelines with specific guidelines to those with heart or lung disease. Written material provided at class time.   Education: Flexibility, Balance, Mind/Body Relaxation: - Group verbal and visual presentation with interactive activity on the components of exercise prescription. Introduces F.I.T.T principle from ACSM for exercise prescriptions. Reviews F.I.T.T. principles of flexibility and balance exercise training including progression. Also discusses the mind body connection.  Reviews various relaxation techniques to help reduce and manage stress (i.e. Deep breathing, progressive muscle relaxation, and visualization). Balance handout provided to take home. Written material provided at class time.   Activity Barriers & Risk Stratification:  Activity Barriers & Cardiac Risk Stratification - 07/13/23 1148       Activity Barriers & Cardiac Risk Stratification   Activity Barriers Joint Problems    Cardiac Risk Stratification Low          6 Minute Walk:  6 Minute Walk     Row Name 07/13/23 1145 09/30/23 1414       6 Minute Walk   Phase Initial Discharge    Distance 1555 feet 2060 feet    Distance % Change -- 32 %    Distance Feet Change -- 505 ft    Walk Time 6 minutes 6 minutes    # of Rest Breaks 0 0    MPH 2.9 3.9    METS 3.96 4.97    RPE 9 9    Perceived Dyspnea  0 0    VO2 Peak 13.87 17.41    Symptoms No No    Resting HR 102 bpm 97 bpm    Resting BP 128/72 122/80    Resting Oxygen Saturation  95 % --    Exercise Oxygen Saturation  during 6 min walk 97 % --    Max Ex. HR 114 bpm 123 bpm    Max Ex. BP 156/74 160/88    2 Minute Post BP 134/72 140/80       Oxygen Initial Assessment:   Oxygen Re-Evaluation:   Oxygen Discharge (Final Oxygen Re-Evaluation):   Initial  Exercise Prescription:  Initial Exercise Prescription - 07/13/23 1100       Date of Initial Exercise RX and Referring Provider   Date 07/13/23    Referring Provider Dr. Georgine Alemu      Oxygen   Maintain Oxygen Saturation 88% or higher      Treadmill   MPH 3    Grade 2    Minutes 15    METs 4.12      Elliptical   Level 1    Speed 3    Minutes 15    METs 3.96      Rower   Level 3    Watts 25    Minutes 15    METs 3.96      Prescription Details   Duration  Progress to 30 minutes of continuous aerobic without signs/symptoms of physical distress      Intensity   THRR 40-80% of Max Heartrate 124-147    Ratings of Perceived Exertion 11-13    Perceived Dyspnea 0-4      Progression   Progression Continue to progress workloads to maintain intensity without signs/symptoms of physical distress.      Resistance Training   Training Prescription Yes    Weight 5lb    Reps 10-15          Perform Capillary Blood Glucose checks as needed.  Exercise Prescription Changes:   Exercise Prescription Changes     Row Name 07/13/23 1100 07/23/23 1500 08/05/23 1100 08/18/23 1300 08/26/23 1400     Response to Exercise   Blood Pressure (Admit) 128/72 134/72 128/58 144/78 --   Blood Pressure (Exercise) 156/74 156/76 144/64 150/76 --   Blood Pressure (Exit) 134/72 126/66 122/62 114/68 --   Heart Rate (Admit) 102 bpm 109 bpm 105 bpm 94 bpm --   Heart Rate (Exercise) 114 bpm 145 bpm 128 bpm 134 bpm --   Heart Rate (Exit) 97 bpm 100 bpm 104 bpm 86 bpm --   Oxygen Saturation (Admit) 95 % -- -- -- --   Oxygen Saturation (Exercise) 97 % -- -- -- --   Oxygen Saturation (Exit) 97 % -- -- -- --   Rating of Perceived Exertion (Exercise) 9 -- 14 15 --   Perceived Dyspnea (Exercise) 0 -- -- -- --   Symptoms none none none none --   Comments results -- First 2 weeks of exercise sessions -- --   Duration -- -- Continue with 30 min of aerobic exercise without signs/symptoms of physical  distress. Continue with 30 min of aerobic exercise without signs/symptoms of physical distress. Continue with 30 min of aerobic exercise without signs/symptoms of physical distress.   Intensity -- -- THRR unchanged THRR unchanged THRR unchanged     Progression   Progression -- Continue to progress workloads to maintain intensity without signs/symptoms of physical distress. Continue to progress workloads to maintain intensity without signs/symptoms of physical distress. Continue to progress workloads to maintain intensity without signs/symptoms of physical distress. Continue to progress workloads to maintain intensity without signs/symptoms of physical distress.   Average METs -- 3.12 4.31 5.14 5.14     Resistance Training   Training Prescription -- Yes Yes Yes Yes   Weight -- 5lb 5lb 5lb 5lb   Reps -- 10-15 10-15 10-15 10-15     Interval Training   Interval Training -- -- No No No     Treadmill   MPH -- 3 3.2 3.3 3.3   Grade -- 2 3 5 5    Minutes -- 15 15 15 15    METs -- 3.71 4.77 5.8 5.8     Elliptical   Level -- 1 -- 1 1   Speed -- 3 -- 3 3   Minutes -- 15 -- 15 15   METs -- 2.5 -- -- --     Biostep-RELP   Level -- -- -- 6 6   SPM -- -- -- 50 50   Minutes -- -- -- 15 15     Rower   Level -- -- 9 9 9    Watts -- -- 67 67 67   Minutes -- -- 15 15 15    METs -- -- 5.62 5.66 5.66     Home Exercise Plan   Plans to continue exercise at -- -- -- --  Home (comment)  walk and use hand weights   Frequency -- -- -- -- Add 2 additional days to program exercise sessions.   Initial Home Exercises Provided -- -- -- -- 08/26/23     Oxygen   Maintain Oxygen Saturation -- -- 88% or higher 88% or higher 88% or higher    Row Name 09/01/23 1300 09/16/23 1600 09/29/23 1400         Response to Exercise   Blood Pressure (Admit) 122/78 132/70 110/68     Blood Pressure (Exit) 140/72 128/62 124/74     Heart Rate (Admit) 78 bpm 92 bpm 87 bpm     Heart Rate (Exercise) 142 bpm 141 bpm 127 bpm      Heart Rate (Exit) 112 bpm 84 bpm 91 bpm     Rating of Perceived Exertion (Exercise) 15 15 14      Symptoms none none none     Duration Continue with 30 min of aerobic exercise without signs/symptoms of physical distress. Continue with 30 min of aerobic exercise without signs/symptoms of physical distress. Continue with 30 min of aerobic exercise without signs/symptoms of physical distress.     Intensity THRR unchanged THRR unchanged THRR unchanged       Progression   Progression Continue to progress workloads to maintain intensity without signs/symptoms of physical distress. Continue to progress workloads to maintain intensity without signs/symptoms of physical distress. Continue to progress workloads to maintain intensity without signs/symptoms of physical distress.     Average METs 5.16 5.7 4.5       Resistance Training   Training Prescription Yes Yes Yes     Weight 5 lb 5 lb 5 lb     Reps 10-15 10-15 10-15       Interval Training   Interval Training No No No       Treadmill   MPH 3.6 3.7 3.5     Grade 5 6 4.5     Minutes 15 15 15      METs 6.24 6.89 5.85       Elliptical   Level 3 7 4      Speed -- 4 6     Minutes 15 15 15      METs 3.7 6 3.8       REL-XR   Level 8 9 --     Minutes 15 15 --     METs 4.7 5.4 --       Home Exercise Plan   Plans to continue exercise at Home (comment)  walk and use hand weights Home (comment)  walk and use hand weights Home (comment)  walk and use hand weights     Frequency Add 2 additional days to program exercise sessions. Add 2 additional days to program exercise sessions. Add 2 additional days to program exercise sessions.     Initial Home Exercises Provided 08/26/23 08/26/23 08/26/23       Oxygen   Maintain Oxygen Saturation 88% or higher 88% or higher 88% or higher        Exercise Comments:   Exercise Comments     Row Name 07/17/23 1051           Exercise Comments First full day of exercise!  Patient was oriented to gym and  equipment including functions, settings, policies, and procedures.  Patient's individual exercise prescription and treatment plan were reviewed.  All starting workloads were established based on the results of the 6 minute walk test done at initial orientation visit.  The plan for exercise progression was also introduced and progression will be customized based on patient's performance and goals.          Exercise Goals and Review:   Exercise Goals     Row Name 07/13/23 1151             Exercise Goals   Increase Physical Activity Yes       Intervention Provide advice, education, support and counseling about physical activity/exercise needs.;Develop an individualized exercise prescription for aerobic and resistive training based on initial evaluation findings, risk stratification, comorbidities and participant's personal goals.       Expected Outcomes Short Term: Attend rehab on a regular basis to increase amount of physical activity.;Long Term: Add in home exercise to make exercise part of routine and to increase amount of physical activity.;Long Term: Exercising regularly at least 3-5 days a week.       Increase Strength and Stamina Yes       Intervention Develop an individualized exercise prescription for aerobic and resistive training based on initial evaluation findings, risk stratification, comorbidities and participant's personal goals.;Provide advice, education, support and counseling about physical activity/exercise needs.       Expected Outcomes Short Term: Increase workloads from initial exercise prescription for resistance, speed, and METs.;Short Term: Perform resistance training exercises routinely during rehab and add in resistance training at home;Long Term: Improve cardiorespiratory fitness, muscular endurance and strength as measured by increased METs and functional capacity ( )       Able to understand and use rate of perceived exertion (RPE) scale Yes       Intervention  Provide education and explanation on how to use RPE scale       Expected Outcomes Short Term: Able to use RPE daily in rehab to express subjective intensity level;Long Term:  Able to use RPE to guide intensity level when exercising independently       Able to understand and use Dyspnea scale Yes       Intervention Provide education and explanation on how to use Dyspnea scale       Expected Outcomes Short Term: Able to use Dyspnea scale daily in rehab to express subjective sense of shortness of breath during exertion;Long Term: Able to use Dyspnea scale to guide intensity level when exercising independently       Knowledge and understanding of Target Heart Rate Range (THRR) Yes       Intervention Provide education and explanation of THRR including how the numbers were predicted and where they are located for reference       Expected Outcomes Short Term: Able to state/look up THRR;Long Term: Able to use THRR to govern intensity when exercising independently;Short Term: Able to use daily as guideline for intensity in rehab       Able to check pulse independently Yes       Intervention Provide education and demonstration on how to check pulse in carotid and radial arteries.;Review the importance of being able to check your own pulse for safety during independent exercise       Expected Outcomes Short Term: Able to explain why pulse checking is important during independent exercise;Long Term: Able to check pulse independently and accurately       Understanding of Exercise Prescription Yes       Intervention Provide education, explanation, and written materials on patient's individual exercise prescription       Expected Outcomes Short Term: Able to explain program exercise prescription;Long Term: Able to  explain home exercise prescription to exercise independently          Exercise Goals Re-Evaluation :  Exercise Goals Re-Evaluation     Row Name 07/17/23 1052 07/23/23 1529 08/05/23 1151 08/18/23 1346  08/26/23 1425     Exercise Goal Re-Evaluation   Exercise Goals Review Increase Physical Activity;Able to understand and use rate of perceived exertion (RPE) scale;Knowledge and understanding of Target Heart Rate Range (THRR);Understanding of Exercise Prescription;Increase Strength and Stamina;Able to understand and use Dyspnea scale;Able to check pulse independently Increase Physical Activity;Increase Strength and Stamina;Understanding of Exercise Prescription Increase Physical Activity;Increase Strength and Stamina;Understanding of Exercise Prescription Increase Physical Activity;Increase Strength and Stamina;Understanding of Exercise Prescription Increase Physical Activity;Increase Strength and Stamina;Able to understand and use rate of perceived exertion (RPE) scale;Able to understand and use Dyspnea scale;Knowledge and understanding of Target Heart Rate Range (THRR);Able to check pulse independently;Understanding of Exercise Prescription   Comments Reviewed RPE and dyspnea scale, THR and program prescription with pt today.  Pt voiced understanding and was given a copy of goals to take home. Review of Milus exercie progress completed . He is new to the program  He exercised with the TM at and 1% incline and ELP  at level Zyier is off to a good start in the program and completed his first 2 weeks of exercise sessions in this review. He already increased his treadmill workload to a speed of 3.2 mph and incline of 3%. He also already increased on the rower to level 9 with an average of 67 watts. We will continue to monitor his progress in the program. Timouthy is doing well in rehab. He increased his workload on the treadmill to a speed of 3.3 mph and incline of 5%. He maintained level 9 on the rower with an average watt of 67. He tried the elliptical at level 1. He also added the biostep at level 6. We will continue to monitor his progress in the program. Reviewed home exercise with pt today.  Pt plans to  walk and use hand weights for exercise.  Reviewed THR, pulse, RPE, sign and symptoms, pulse oximetery and when to call 911 or MD.  Also discussed weather considerations and indoor options.  Pt voiced understanding.   Expected Outcomes Short: Use RPE daily to regulate intensity. Long: Follow program prescription in THR. STG continue to attend scheduled sessions,  progression as tolerated.  LTG  continue with exercise progression Short: Continue to follow current exercise prescription. Long: Continue exercise to improve strength and stamina. Short: Continue to progressively increase treadmill workload. Long: Continue exercise to improve strength and stamina. Short: add 1-2 days a week of exercise at home on off days of rehab. Long: maintain independent exercise routine upon graduation from cardiac rehab.    Row Name 09/01/23 1333 09/16/23 1608 09/24/23 1825 09/29/23 1414 09/30/23 1419     Exercise Goal Re-Evaluation   Exercise Goals Review Increase Physical Activity;Increase Strength and Stamina;Understanding of Exercise Prescription Increase Physical Activity;Increase Strength and Stamina;Understanding of Exercise Prescription Increase Physical Activity;Increase Strength and Stamina;Understanding of Exercise Prescription Increase Physical Activity;Increase Strength and Stamina;Understanding of Exercise Prescription Increase Physical Activity;Able to understand and use rate of perceived exertion (RPE) scale;Increase Strength and Stamina   Comments Damontae is doing well in rehab. He recently increased his speed on the treadmill to 3.6 mph while maintaining an incline of 5%. He also began using the XR at level 8 and improved to level 3 on the elliptical. We will continue to monitor  his progress in the program. Keshav continues to do well in rehab. He was able to increase his treadmill workload to a speed of 3. and 6% incline. He was also able to increase from level 3 to 7 on the Eliptical. We will continue to  monitor his progress in the program. Dilon is doing well in rehab and doing well with his home exercise plan. He is walking and doing dumbbells for resistance and home one additional day a week. It models it like the program and it helps him keep his routine going. Arnell continues to do well in rehab. He is due for his post and will look to improve on it. He also continues to do well with his workloads on the treadmill and elliptical. We will continue to monitor his progress in the program. [name] improved by 32% (505 feet) on his 6 MWT! He plans to exercise at the [facility] upon graduation from cardiac rehab.   Expected Outcomes Short: Continue to progressively increase treadmill workload. Long: Continue exercise to improve strength and stamina. Short: Continue to increase both treadmill and eliptical workload. Long: Continue exercise to improev strength and stamina. Short: Continue with home exercise regimen in addition to rehab. Long: Continue exercise to improve strength and stamina. Short: Improve on post . Long: Continue to increase overall METs and stamina. Short: graduate from cardiac rehab. Long: maintain independent exercise routine upon graduation from cardiac rehab.      Discharge Exercise Prescription (Final Exercise Prescription Changes):  Exercise Prescription Changes - 09/29/23 1400       Response to Exercise   Blood Pressure (Admit) 110/68    Blood Pressure (Exit) 124/74    Heart Rate (Admit) 87 bpm    Heart Rate (Exercise) 127 bpm    Heart Rate (Exit) 91 bpm    Rating of Perceived Exertion (Exercise) 14    Symptoms none    Duration Continue with 30 min of aerobic exercise without signs/symptoms of physical distress.    Intensity THRR unchanged      Progression   Progression Continue to progress workloads to maintain intensity without signs/symptoms of physical distress.    Average METs 4.5      Resistance Training   Training Prescription Yes    Weight  5 lb    Reps 10-15      Interval Training   Interval Training No      Treadmill   MPH 3.5    Grade 4.5    Minutes 15    METs 5.85      Elliptical   Level 4    Speed 6    Minutes 15    METs 3.8      Home Exercise Plan   Plans to continue exercise at Home (comment)   walk and use hand weights   Frequency Add 2 additional days to program exercise sessions.    Initial Home Exercises Provided 08/26/23      Oxygen   Maintain Oxygen Saturation 88% or higher          Nutrition:  Target Goals: Understanding of nutrition guidelines, daily intake of sodium 1500mg , cholesterol 200mg , calories 30% from fat and 7% or less from saturated fats, daily to have 5 or more servings of fruits and vegetables.  Education: Nutrition 1 -Group instruction provided by verbal, written material, interactive activities, discussions, models, and posters to present general guidelines for heart healthy nutrition including macronutrients, label reading, and promoting whole foods over processed counterparts.  Education serves as Pensions consultant of discussion of heart healthy eating for all. Written material provided at class time.    Education: Nutrition 2 -Group instruction provided by verbal, written material, interactive activities, discussions, models, and posters to present general guidelines for heart healthy nutrition including sodium, cholesterol, and saturated fat. Providing guidance of habit forming to improve blood pressure, cholesterol, and body weight. Written material provided at class time. Flowsheet Row Cardiac Rehab from 09/30/2023 in [facility] Cardiac and Pulmonary Rehab  Date 09/09/23  Educator jg  Instruction Review Code 1- Verbalizes Understanding      Biometrics:  Pre Biometrics - 07/13/23 1152       Pre Biometrics   Height 5' 11 (1.803 m)    Weight 228 lb 1.6 oz (103.5 kg)    Waist Circumference 42.5 inches    Hip Circumference 39 inches    Waist to Hip Ratio 1.09 %    BMI  (Calculated) 31.83    Single Leg Stand 30 seconds          Post Biometrics - 09/30/23 1416        Post  Biometrics   Height 5' 11 (1.803 m)    Weight 229 lb 3.2 oz (104 kg)    Waist Circumference 43 inches    Hip Circumference 42 inches    Waist to Hip Ratio 1.02 %    BMI (Calculated) 31.98    Single Leg Stand 30 seconds          Nutrition Therapy Plan and Nutrition Goals:  Nutrition Therapy & Goals - 07/13/23 1336       Nutrition Therapy   Diet Cardiac, Low Na    Protein (specify units) 90    Fiber 30 grams    Whole Grain Foods 3 servings    Saturated Fats 15 max. grams    Fruits and Vegetables 5 servings/day    Sodium 2 grams      Personal Nutrition Goals   Nutrition Goal Eat 15-30gProtein and 30-60gCarbs at each meal.    Personal Goal #2 Read labels and reduce sodium intake to below 2300mg . Ideally 1500mg  per day.    Personal Goal #3 Reduce saturated fat, less than 12g per day. Replace bad fats for more heart healthy fats.    Comments Patient drinking fruit juice and almond milk mostly. Reviewed his food recall, spoke with him about his high sugar intake and low nutrient intake. Educated on quality and quality of carbohydrates. Provided handout on mediterranean diet. Educated on types of fats, sources, and how to read facts labels. Brainstormed several meal and snack ideas with foods he likes, focusing on building more balanced plates with more nutrient rich foods cutting back on added sugar.      Intervention Plan   Intervention Prescribe, educate and counsel regarding individualized specific dietary modifications aiming towards targeted core components such as weight, hypertension, lipid management, diabetes, heart failure and other comorbidities.;Nutrition handout(s) given to patient.    Expected Outcomes Short Term Goal: Understand basic principles of dietary content, such as calories, fat, sodium, cholesterol and nutrients.;Short Term Goal: A plan has been  developed with personal nutrition goals set during dietitian appointment.;Long Term Goal: Adherence to prescribed nutrition plan.          Nutrition Assessments:  MEDIFICTS Score Key: >=70 Need to make dietary changes  40-70 Heart Healthy Diet <= 40 Therapeutic Level Cholesterol Diet  Flowsheet Row Cardiac Rehab from 10/01/2023 in [facility] Cardiac and Pulmonary Rehab  Picture Your Plate  Total Score on Discharge 62   Picture Your Plate Scores: <59 Unhealthy dietary pattern with much room for improvement. 41-50 Dietary pattern unlikely to meet recommendations for good health and room for improvement. 51-60 More healthful dietary pattern, with some room for improvement.  >60 Healthy dietary pattern, although there may be some specific behaviors that could be improved.    Nutrition Goals Re-Evaluation:  Nutrition Goals Re-Evaluation     Row Name 08/05/23 1411 09/09/23 1355 09/24/23 1829         Goals   Comment Cyril reports that he does watch his sodium and has switched to diet sodas instead of regular. He has a protein drink at work and eats one main meal when he gets home. He tries to make sure that meal is balanced with carb, protein, and vegetables. He was encouraged to try to eat smaller more frequent meals but that is hard with his work schedule. Jerrik is doing better with his nutrition. He is watching his sodium and drinking diet sodas more. He is attending nutrition education class today. Mitsuru is doing very well with his nutrition goals. He has been working on adding in healthy and recommended amount of carbs and protein. He is watching his sodium and saturated fats. He enjoys making overnight oats and has some vegan meals he really likes. He has stopped drinking diet sodas and has switched to flavored mineral water.     Expected Outcome Short: continue watching sodium and try to add smaller more frequent meals. Long: maintain heart healthy diet. STG: Attend nutrtion education class  and continue to cut back/eliminate sugary beverages. LTG: maintain heart healthy diet. STG: Keep up with watching sodium, saturated fats, and carb and protein amounts at each meal. LTG: maintain heart healthy diet.        Nutrition Goals Discharge (Final Nutrition Goals Re-Evaluation):  Nutrition Goals Re-Evaluation - 09/24/23 1829       Goals   Comment Eluterio is doing very well with his nutrition goals. He has been working on adding in healthy and recommended amount of carbs and protein. He is watching his sodium and saturated fats. He enjoys making overnight oats and has some vegan meals he really likes. He has stopped drinking diet sodas and has switched to flavored mineral water.    Expected Outcome STG: Keep up with watching sodium, saturated fats, and carb and protein amounts at each meal. LTG: maintain heart healthy diet.          Psychosocial: Target Goals: Acknowledge presence or absence of significant depression and/or stress, maximize coping skills, provide positive support system. Participant is able to verbalize types and ability to use techniques and skills needed for reducing stress and depression.   Education: Stress, Anxiety, and Depression - Group verbal and visual presentation to define topics covered.  Reviews how body is impacted by stress, anxiety, and depression.  Also discusses healthy ways to reduce stress and to treat/manage anxiety and depression. Written material provided at class time.   Education: Sleep Hygiene -Provides group verbal and written instruction about how sleep can affect your health.  Define sleep hygiene, discuss sleep cycles and impact of sleep habits. Review good sleep hygiene tips.   Initial Review & Psychosocial Screening:  Initial Psych Review & Screening - 07/08/23 1441       Initial Review   Current issues with Current Psychotropic Meds;Current Anxiety/Panic;Current Sleep Concerns      Family Dynamics   Good Support System? Yes  Comments Zakar has some anxiety when he is in crowed areas when he is out. He was sleeping alot for awhile and now he is sleeping ok. He takes medication for his sleep and has been helping for the most part.      Barriers   Psychosocial barriers to participate in program The patient should benefit from training in stress management and relaxation.;There are no identifiable barriers or psychosocial needs.      Screening Interventions   Interventions Provide feedback about the scores to participant;To provide support and resources with identified psychosocial needs;Encouraged to exercise    Expected Outcomes Short Term goal: Utilizing psychosocial counselor, staff and physician to assist with identification of specific Stressors or current issues interfering with healing process. Setting desired goal for each stressor or current issue identified.;Long Term Goal: Stressors or current issues are controlled or eliminated.;Short Term goal: Identification and review with participant of any Quality of Life or Depression concerns found by scoring the questionnaire.;Long Term goal: The participant improves quality of Life and PHQ9 Scores as seen by post scores and/or verbalization of changes          Quality of Life Scores:   Quality of Life - 10/01/23 1341       Quality of Life Scores   Health/Function Post 24.5 %    Socioeconomic Post 20.79 %    Psych/Spiritual Post 23.14 %    Family Post 15.6 %    GLOBAL Post 22.15 %         Scores of 19 and below usually indicate a poorer quality of life in these areas.  A difference of  2-3 points is a clinically meaningful difference.  A difference of 2-3 points in the total score of the Quality of Life Index has been associated with significant improvement in overall quality of life, self-image, physical symptoms, and general health in studies assessing change in quality of life.  PHQ-9: Review Flowsheet       10/01/2023 07/13/2023  Depression screen PHQ  2/9  Decreased Interest 0 0  Down, Depressed, Hopeless 0 0  PHQ - 2 Score 0 0  Altered sleeping 2 3  Tired, decreased energy 1 0  Change in appetite 0 0  Feeling bad or failure about yourself  0 0  Trouble concentrating 0 0  Moving slowly or fidgety/restless 0 0  Suicidal thoughts 0 0  PHQ-9 Score 3 3  Difficult doing work/chores Not difficult at all Not difficult at all   Interpretation of Total Score  Total Score Depression Severity:  1-4 = Minimal depression, 5-9 = Mild depression, 10-14 = Moderate depression, 15-19 = Moderately severe depression, 20-27 = Severe depression   Psychosocial Evaluation and Intervention:  Psychosocial Evaluation - 07/08/23 1444       Psychosocial Evaluation & Interventions   Interventions Encouraged to exercise with the program and follow exercise prescription;Relaxation education;Stress management education    Comments Siddharth has some anxiety when he is in crowed areas when he is out. He was sleeping alot for awhile and now he is sleeping ok. He takes medication for his sleep and has been helping for the most part.    Expected Outcomes Short: Start [facility] to help with mood. Long: Maintain a healthy mental state    Continue Psychosocial Services  Follow up required by staff          Psychosocial Re-Evaluation:  Psychosocial Re-Evaluation     Row Name 08/05/23 1422 09/09/23 1357 09/24/23 1827  Psychosocial Re-Evaluation   Current issues with Current Psychotropic Meds;Current Anxiety/Panic;Current Sleep Concerns Current Psychotropic Meds;Current Anxiety/Panic;Current Sleep Concerns Current Psychotropic Meds;Current Sleep Concerns     Comments Tyress stated that he does not sleep well but that is nothing new and there have been no changes recently in sleep patterns. He did start back to work which has caused stress levels to increase some but he states he is mostly able to manage his stress levels. He continue to take meds for his mood  and to control anxiety and he reports that they are working and all of that has been stable. Samuel states he still does not sleep well and there has been no changes in a long time with his sleep patterns. He has talked to his doctor about his concerns and has tried different sleep aids over the years. He states he does have work stress but from the most part he is able to deal with it. He does report having  support system and the resources he needs to manage his mental health. He states that the exercise he is doing during cardiac rehab does help his mental state. Dior states he does not have any new stress. He is still not sleeping well, which does not help with his long 10 hours a day at work. He will occasionally have a good night of sleep. He is consistent with taking his psychotropic meds daily. He has a god support system in place.     Expected Outcomes Short: continue to adjust to being back to work and managing that new stress. Long: continue to exercise for the mental health benefits of exericse. Short: continue to attend cardiac rehab for the mental health benefits of exercise. Long: manage stress levels and maintain exercise routine upon graduation to help with stress. Short: continue to attend cardiac rehab for the mental health benefits of exercise to help manage his stress levels. Long: continue manage stress levels and maintain exercise routine upon graduation to help with stress.     Interventions Encouraged to attend Cardiac Rehabilitation for the exercise Encouraged to attend Cardiac Rehabilitation for the exercise Encouraged to attend Cardiac Rehabilitation for the exercise     Continue Psychosocial Services  Follow up required by staff Follow up required by staff Follow up required by staff        Psychosocial Discharge (Final Psychosocial Re-Evaluation):  Psychosocial Re-Evaluation - 09/24/23 1827       Psychosocial Re-Evaluation   Current issues with Current Psychotropic  Meds;Current Sleep Concerns    Comments Samer states he does not have any new stress. He is still not sleeping well, which does not help with his long 10 hours a day at work. He will occasionally have a good night of sleep. He is consistent with taking his psychotropic meds daily. He has a god support system in place.    Expected Outcomes Short: continue to attend cardiac rehab for the mental health benefits of exercise to help manage his stress levels. Long: continue manage stress levels and maintain exercise routine upon graduation to help with stress.    Interventions Encouraged to attend Cardiac Rehabilitation for the exercise    Continue Psychosocial Services  Follow up required by staff          Vocational Rehabilitation: Provide vocational rehab assistance to qualifying candidates.   Vocational Rehab Evaluation & Intervention:   Education: Education Goals: Education classes will be provided on a variety of topics geared toward better understanding of heart health  and risk factor modification. Participant will state understanding/return demonstration of topics presented as noted by education test scores.  Learning Barriers/Preferences:  Learning Barriers/Preferences - 07/08/23 1438       Learning Barriers/Preferences   Learning Barriers None    Learning Preferences None          General Cardiac Education Topics:  AED/CPR: - Group verbal and written instruction with the use of models to demonstrate the basic use of the AED with the basic ABC's of resuscitation.   Test and Procedures: - Group verbal and visual presentation and models provide information about basic cardiac anatomy and function. Reviews the testing methods done to diagnose heart disease and the outcomes of the test results. Describes the treatment choices: Medical Management, Angioplasty, or Coronary Bypass Surgery for treating various heart conditions including Myocardial Infarction, Angina, Valve Disease,  and Cardiac Arrhythmias. Written material provided at class time. Flowsheet Row Cardiac Rehab from 09/30/2023 in [facility] Cardiac and Pulmonary Rehab  Education need identified 07/13/23    Medication Safety: - Group verbal and visual instruction to review commonly prescribed medications for heart and lung disease. Reviews the medication, class of the drug, and side effects. Includes the steps to properly store meds and maintain the prescription regimen. Written material provided at class time. Flowsheet Row Cardiac Rehab from 09/30/2023 in [facility] Cardiac and Pulmonary Rehab  Date 09/23/23  Educator [facility]  Instruction Review Code 1- Verbalizes Understanding    Intimacy: - Group verbal instruction through game format to discuss how heart and lung disease can affect sexual intimacy. Written material provided at class time. Flowsheet Row Cardiac Rehab from 09/30/2023 in [facility] Cardiac and Pulmonary Rehab  Date 08/26/23  Educator [facility]  Instruction Review Code 1- Verbalizes Understanding    Know Your Numbers and Heart Failure: - Group verbal and visual instruction to discuss disease risk factors for cardiac and pulmonary disease and treatment options.  Reviews associated critical values for Overweight/Obesity, Hypertension, Cholesterol, and Diabetes.  Discusses basics of heart failure: signs/symptoms and treatments.  Introduces Heart Failure Zone chart for action plan for heart failure. Written material provided at class time. Flowsheet Row Cardiac Rehab from 09/30/2023 in [facility] Cardiac and Pulmonary Rehab  Date 07/22/23  Educator sb  Instruction Review Code 1- Verbalizes Understanding    Infection Prevention: - Provides verbal and written material to individual with discussion of infection control including proper hand washing and proper equipment cleaning during exercise session. Flowsheet Row Cardiac Rehab from 09/30/2023 in [facility] Cardiac and Pulmonary Rehab  Date 07/13/23  Educator [facility]  Instruction Review  Code 1- Verbalizes Understanding    Falls Prevention: - Provides verbal and written material to individual with discussion of falls prevention and safety. Flowsheet Row Cardiac Rehab from 09/30/2023 in [facility] Cardiac and Pulmonary Rehab  Date 07/13/23  Educator [facility]  Instruction Review Code 1- Verbalizes Understanding    Other: -Provides group and verbal instruction on various topics (see comments)   Knowledge Questionnaire Score:  Knowledge Questionnaire Score - 10/01/23 1340       Knowledge Questionnaire Score   Pre Score 23/26    Post Score 23/26          Core Components/Risk Factors/Patient Goals at Admission:  Personal Goals and Risk Factors at Admission - 07/08/23 1437       Core Components/Risk Factors/Patient Goals on Admission    Weight Management Yes;Weight Loss    Intervention Weight Management: Develop a combined nutrition and exercise program designed to reach desired caloric intake,  while maintaining appropriate intake of nutrient and fiber, sodium and fats, and appropriate energy expenditure required for the weight goal.;Weight Management: Provide education and appropriate resources to help participant work on and attain dietary goals.;Obesity: Provide education and appropriate resources to help participant work on and attain dietary goals.;Weight Management/Obesity: Establish reasonable short term and long term weight goals.    Expected Outcomes Short Term: Continue to assess and modify interventions until short term weight is achieved;Weight Loss: Understanding of general recommendations for a balanced deficit meal plan, which promotes 1-2 lb weight loss per week and includes a negative energy balance of [phone] kcal/d;Understanding recommendations for meals to include 15-35% energy as protein, 25-35% energy from fat, 35-60% energy from carbohydrates, less than 200mg  of dietary cholesterol, 20-35 gm of total fiber daily;Understanding of distribution of calorie intake  throughout the day with the consumption of 4-5 meals/snacks    Tobacco Cessation Yes    Number of packs per day 0    Intervention Assist the participant in steps to quit. Provide individualized education and counseling about committing to Tobacco Cessation, relapse prevention, and pharmacological support that can be provided by physician.;Education officer, environmental, assist with locating and accessing local/national Quit Smoking programs, and support quit date choice.    Expected Outcomes Short Term: Will demonstrate readiness to quit, by selecting a quit date.;Long Term: Complete abstinence from all tobacco products for at least 12 months from quit date.;Short Term: Will quit all tobacco product use, adhering to prevention of relapse plan.    Diabetes Yes    Intervention Provide education about signs/symptoms and action to take for hypo/hyperglycemia.;Provide education about proper nutrition, including hydration, and aerobic/resistive exercise prescription along with prescribed medications to achieve blood glucose in normal ranges: Fasting glucose 65-99 mg/dL    Expected Outcomes Long Term: Attainment of HbA1C < 7%.;Short Term: Participant verbalizes understanding of the signs/symptoms and immediate care of hyper/hypoglycemia, proper foot care and importance of medication, aerobic/resistive exercise and nutrition plan for blood glucose control.    Hypertension Yes    Intervention Provide education on lifestyle modifcations including regular physical activity/exercise, weight management, moderate sodium restriction and increased consumption of fresh fruit, vegetables, and low fat dairy, alcohol moderation, and smoking cessation.;Monitor prescription use compliance.    Expected Outcomes Short Term: Continued assessment and intervention until BP is < 140/57mm HG in hypertensive participants. < 130/46mm HG in hypertensive participants with diabetes, heart failure or chronic kidney disease.;Long Term:  Maintenance of blood pressure at goal levels.          Education:Diabetes - Individual verbal and written instruction to review signs/symptoms of diabetes, desired ranges of glucose level fasting, after meals and with exercise. Acknowledge that pre and post exercise glucose checks will be done for 3 sessions at entry of program. Flowsheet Row Cardiac Rehab from 09/30/2023 in [facility] Cardiac and Pulmonary Rehab  Date 07/13/23  Educator [facility]  Instruction Review Code 1- Verbalizes Understanding    Core Components/Risk Factors/Patient Goals Review:   Goals and Risk Factor Review     Row Name 08/05/23 1418 09/09/23 1406 09/24/23 1832         Core Components/Risk Factors/Patient Goals Review   Personal Goals Review Hypertension;Diabetes Weight Management/Obesity;Tobacco Cessation;Hypertension Weight Management/Obesity;Diabetes;Hypertension     Review Patient was asked about monitoring blood glucose and he stated no one ever told him to check his blood sugars at home and he doesn't have a monitor. He was encouraged to aske his doctor about that and see if he can  get a blood glucose monitor for home if his doctor wants him to be checking blood sugars. He does take his DM meds regularly and follows up with doctor for all lab work and appointments. He does have a blood pressure cuff and was encouraged to start checking blood pressure at home. Ajani reports that he still has not gotten in the habbit of checking BP at home. He was encouraged to do so. He reports he weight has been mostly steady but he has lost weight slow and steady over the last several years. He reports still using dip but that he has been cutting back on the amount he dips. He does not have a quit date set but just keeps trying to cut back. His inurance company asked him if he wanted them to send him a blood glucose meter and he agreed but has not yet recieved it. Andron is doing great with his weight loss goal and reports his weight is  trending downward and weighed in today at 228.4 lbs. He is down 3 lbs this week. He has begun tracking his blood pressure regularly. He did not recieve a glucose meter as discussed in the last review as his doctor states he does not need it.     Expected Outcomes Short: talk to doctor about getting a blood glucose meter. start checking blood pressure at home. Long: control cardiac risk factors. Short: follow up about recieving a blood glucose meter. Continue to cut back on dipping. Long: become become independent with monitoring blood pressure and blood sugar. Become tobacco free. Short: Continue with tracking blood pressure and weight. Long: Continue to follow healthy habits and montioring blood pressure and weight.        Core Components/Risk Factors/Patient Goals at Discharge (Final Review):   Goals and Risk Factor Review - 09/24/23 1832       Core Components/Risk Factors/Patient Goals Review   Personal Goals Review Weight Management/Obesity;Diabetes;Hypertension    Review Abrahan is doing great with his weight loss goal and reports his weight is trending downward and weighed in today at 228.4 lbs. He is down 3 lbs this week. He has begun tracking his blood pressure regularly. He did not recieve a glucose meter as discussed in the last review as his doctor states he does not need it.    Expected Outcomes Short: Continue with tracking blood pressure and weight. Long: Continue to follow healthy habits and montioring blood pressure and weight.          ITP Comments:  ITP Comments     Row Name 07/08/23 1440 07/13/23 1145 07/17/23 1051 07/22/23 0943 08/19/23 0824   ITP Comments Virtual Visit completed. Patient informed on EP and RD appointment and 6 Minute walk test. Patient also informed of patient health questionnaires on My Chart. Patient Verbalizes understanding. Visit diagnosis can be found in [facility] 06/01/[facility]23. Completed and gym orientation for cardiac rehab. Initial ITP created and sent for  review to Dr. Oneil Pinal, Medical Director. First full day of exercise!  Patient was oriented to gym and equipment including functions, settings, policies, and procedures.  Patient's individual exercise prescription and treatment plan were reviewed.  All starting workloads were established based on the results of the 6 minute walk test done at initial orientation visit.  The plan for exercise progression was also introduced and progression will be customized based on patient's performance and goals. 30 Day review completed. Medical Director ITP review done, changes made as directed, and signed approval by  Medical Director. new to program 30 Day review completed. Medical Director ITP review done, changes made as directed, and signed approval by Medical Director.    Row Name 09/16/23 0901 10/14/23 1158         ITP Comments 30 Day review completed. Medical Director ITP review done, changes made as directed, and signed approval by Medical Director. 30 Day review completed. Medical Director ITP review done, changes made as directed, and signed approval by Medical Director.         Comments: 30 day review

## 2023-10-14 NOTE — Progress Notes (Signed)
 Daily Session Note  Patient Details  Name: Argyle Gustafson MRN: [id number] Date of Birth: November 16, 1960 Referring Provider:   Flowsheet Row Cardiac Rehab from 07/13/2023 in [facility] Cardiac and Pulmonary Rehab  Referring Provider Dr. Georgine Mc    Encounter Date: 10/14/2023  Check In:  Session Check In - 10/14/23 1338       Check-In   Supervising physician immediately available to respond to emergencies See telemetry face sheet for immediately available ER MD    Location [facility]-Cardiac & Pulmonary Rehab    Staff Present Burnard Davenport RN,BSN,MPA;Maxon Burnell BS, Exercise Physiologist;Laura Cates RN,BSN;Misael Mcgaha Dyane BS, ACSM CEP, Exercise Physiologist;Meredith Tressa RN,BSN    Virtual Visit No    Medication changes reported     No    Fall or balance concerns reported    No    Tobacco Cessation No Change    Warm-up and Cool-down Performed on first and last piece of equipment    Resistance Training Performed Yes    VAD Patient? No    PAD/SET Patient? No      Pain Assessment   Currently in Pain? No/denies             Social History   Tobacco Use  Smoking Status Never  Smokeless Tobacco Current   Types: Snuff    Goals Met:  Independence with exercise equipment Exercise tolerated well No report of concerns or symptoms today Strength training completed today  Goals Unmet:  Not Applicable  Comments: Pt able to follow exercise prescription today without complaint.  Will continue to monitor for progression.    Dr. Oneil Pinal is Medical Director for [facility] Cardiac Rehabilitation.  Dr. Fuad Aleskerov is Medical Director for [facility] Pulmonary Rehabilitation.

## 2023-10-15 ENCOUNTER — Encounter: Admitting: *Deleted

## 2023-10-15 DIAGNOSIS — Z48812 Encounter for surgical aftercare following surgery on the circulatory system: Secondary | ICD-10-CM | POA: Diagnosis not present

## 2023-10-15 DIAGNOSIS — Z952 Presence of prosthetic heart valve: Secondary | ICD-10-CM

## 2023-10-15 NOTE — Progress Notes (Signed)
 Daily Session Note  Patient Details  Name: Daniel Crosby MRN: [id number] Date of Birth: 06/29/1960 Referring Provider:   Flowsheet Row Cardiac Rehab from 07/13/2023 in [facility] Cardiac and Pulmonary Rehab  Referring Provider Dr. Georgine Mc    Encounter Date: 10/15/2023  Check In:  Session Check In - 10/15/23 1346       Check-In   Supervising physician immediately available to respond to emergencies See telemetry face sheet for immediately available ER MD    Location [facility]-Cardiac & Pulmonary Rehab    Staff Present Hoy Rodney RN,BSN;Joseph Rolinda NORWOOD HARMAN Verlie Laird, [place], Exercise Physiologist;Margaret Best, MS, Exercise Physiologist    Virtual Visit No    Medication changes reported     No    Fall or balance concerns reported    No    Warm-up and Cool-down Performed on first and last piece of equipment    Resistance Training Performed Yes    VAD Patient? No    PAD/SET Patient? No      Pain Assessment   Currently in Pain? No/denies             Social History   Tobacco Use  Smoking Status Never  Smokeless Tobacco Current   Types: Snuff    Goals Met:  Independence with exercise equipment Exercise tolerated well No report of concerns or symptoms today Strength training completed today  Goals Unmet:  Not Applicable  Comments: Pt able to follow exercise prescription today without complaint.  Will continue to monitor for progression.    Dr. Oneil Pinal is Medical Director for [facility] Cardiac Rehabilitation.  Dr. Fuad Aleskerov is Medical Director for [facility] Pulmonary Rehabilitation.

## 2023-10-19 ENCOUNTER — Encounter

## 2023-10-21 ENCOUNTER — Encounter: Attending: Internal Medicine | Admitting: Emergency Medicine

## 2023-10-21 DIAGNOSIS — Z48812 Encounter for surgical aftercare following surgery on the circulatory system: Secondary | ICD-10-CM | POA: Diagnosis present

## 2023-10-21 DIAGNOSIS — Z952 Presence of prosthetic heart valve: Secondary | ICD-10-CM | POA: Diagnosis not present

## 2023-10-21 DIAGNOSIS — Z95828 Presence of other vascular implants and grafts: Secondary | ICD-10-CM | POA: Diagnosis present

## 2023-10-21 NOTE — Progress Notes (Signed)
 Daily Session Note  Patient Details  Name: Daniel Crosby MRN: [id number] Date of Birth: 07-Jan-1961 Referring Provider:   Flowsheet Row Cardiac Rehab from 07/13/2023 in [facility] Cardiac and Pulmonary Rehab  Referring Provider Dr. Georgine Mc    Encounter Date: 10/21/2023  Check In:  Session Check In - 10/21/23 1400       Check-In   Supervising physician immediately available to respond to emergencies See telemetry face sheet for immediately available ER MD    Location [facility]-Cardiac & Pulmonary Rehab    Staff Present Devaughn Jaeger, BS, Exercise Physiologist;Kelly Dyane HECKLE, ACSM CEP, Exercise Physiologist;Karisha Marlin RN,BSN;Kelly Bollinger [facility]    Virtual Visit No    Medication changes reported     No    Fall or balance concerns reported    No    Tobacco Cessation No Change    Warm-up and Cool-down Performed on first and last piece of equipment    Resistance Training Performed Yes    VAD Patient? No    PAD/SET Patient? No      Pain Assessment   Currently in Pain? No/denies             Social History   Tobacco Use  Smoking Status Never  Smokeless Tobacco Current   Types: Snuff    Goals Met:  Independence with exercise equipment Exercise tolerated well No report of concerns or symptoms today Strength training completed today  Goals Unmet:  Not Applicable  Comments:  Daniel Crosby graduated today from  rehab with 36 sessions completed.  Details of the patient's exercise prescription and what He needs to do in order to continue the prescription and progress were discussed with patient.  Patient was given a copy of prescription and goals.  Patient verbalized understanding. Daniel Crosby plans to continue to exercise by walking and using hand weights.    Dr. Oneil Pinal is Medical Director for [facility] Cardiac Rehabilitation.  Dr. Fuad Aleskerov is Medical Director for [facility] Pulmonary Rehabilitation.

## 2023-10-21 NOTE — Progress Notes (Signed)
 Discharge Summary   Daniel Crosby  DOB [birth year]   [name] graduated today from  rehab with 36 sessions completed.  Details of the patient's exercise prescription and what He needs to do in order to continue the prescription and progress were discussed with patient.  Patient was given a copy of prescription and goals.  Patient verbalized understanding. [name] plans to continue to exercise by walking and using hand weights.   6 Minute Walk     Row Name 07/13/23 1145 09/30/23 1414       6 Minute Walk   Phase Initial Discharge    Distance 1555 feet 2060 feet    Distance % Change -- 32 %    Distance Feet Change -- 505 ft    Walk Time 6 minutes 6 minutes    # of Rest Breaks 0 0    MPH 2.9 3.9    METS 3.96 4.97    RPE 9 9    Perceived Dyspnea  0 0    VO2 Peak 13.87 17.41    Symptoms No No    Resting HR 102 bpm 97 bpm    Resting BP 128/72 122/80    Resting Oxygen Saturation  95 % --    Exercise Oxygen Saturation  during 6 min walk 97 % --    Max Ex. HR 114 bpm 123 bpm    Max Ex. BP 156/74 160/88    2 Minute Post BP 134/72 140/80

## 2023-10-21 NOTE — Progress Notes (Signed)
 Cardiac Individual Treatment Plan  Patient Details  Name: Daniel Crosby MRN: [id number] Date of Birth: 01-19-1961 Referring Provider:   Flowsheet Row Cardiac Rehab from 07/13/2023 in [facility] Cardiac and Pulmonary Rehab  Referring Provider Dr. 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  Patient's Home Medications on Admission:  Current Outpatient Medications:    amLODipine (NORVASC) 10 MG tablet, Take 10 mg by mouth daily., Disp: , Rfl:    aspirin EC 81 MG tablet, Take 81 mg by mouth., Disp: , Rfl:    citalopram (CELEXA) 40 MG tablet, Take 40 mg by mouth., Disp: , Rfl:    DULoxetine (CYMBALTA) 60 MG capsule, Take 60 mg by mouth., Disp: , Rfl:    famotidine (PEPCID) 20 MG tablet, Take 20 mg by mouth 2 (two) times daily., Disp: , Rfl:    fluticasone (FLONASE) 50 MCG/ACT nasal spray, 2 sprays., Disp: , Rfl:    furosemide (LASIX) 40 MG tablet, Take 40 mg by mouth., Disp: , Rfl:    lisinopril (ZESTRIL) 40 MG tablet, Take 40 mg by mouth daily., Disp: , Rfl:    metFORMIN (GLUCOPHAGE) 500 MG tablet, TAKE 1 TABLET WITH         BREAKFAST, Disp: , Rfl:    naproxen  (NAPROSYN ) 500 MG tablet, Take 1 tablet (500 mg total) by mouth 2 (two) times daily with a meal., Disp: 14 tablet, Rfl: 0   oxyCODONE (OXY IR/ROXICODONE) 5 MG immediate release tablet, Take by mouth. (Patient not taking: Reported on 07/08/2023), Disp: , Rfl:    potassium chloride SA (KLOR-CON M) 20 MEQ tablet, Take 20 mEq by mouth., Disp: , Rfl:    pravastatin (PRAVACHOL) 40 MG tablet, SMARTSIG:1 Tablet(s) By Mouth Every Evening, Disp: , Rfl:    pravastatin (PRAVACHOL) 80 MG tablet, Take 80 mg by mouth., Disp: , Rfl:    Probiotic Product (PROBIOTIC DAILY) CAPS, Take 1 tablet by mouth every evening., Disp: , Rfl:    traZODone (DESYREL) 50 MG tablet, Take 50 mg by mouth., Disp: , Rfl:    warfarin  (COUMADIN) 10 MG tablet, Take 10 mg by mouth., Disp: , Rfl:   Past Medical History: No past medical history on file.  Tobacco Use: Social History   Tobacco Use  Smoking Status Never  Smokeless Tobacco Current   Types: Snuff    Labs: Review Flowsheet        No data to display           Exercise Target Goals: Exercise Program Goal: Individual exercise prescription set using results from initial 6 min walk test and THRR while considering  patient's activity barriers and safety.   Exercise Prescription Goal: Initial exercise prescription builds to 30-45 minutes a day of aerobic activity, 2-3 days per week.  Home exercise guidelines will be given to patient during program as part of exercise prescription that the participant will acknowledge.   Education: Aerobic Exercise: - Group verbal and visual presentation on the components of exercise prescription. Introduces F.I.T.T principle from ACSM for exercise prescriptions.  Reviews F.I.T.T. principles of aerobic exercise including progression. Written material provided at class time. Flowsheet Row Cardiac Rehab from 10/14/2023 in [facility] Cardiac and Pulmonary Rehab  Education need identified 07/13/23  Date 08/26/23  Educator [facility]  Instruction Review Code 1- [employer] Understanding    Education: Resistance Exercise: - Group verbal and visual  presentation on the components of exercise prescription. Introduces F.I.T.T principle from ACSM for exercise prescriptions  Reviews F.I.T.T. principles of resistance exercise including progression. Written material provided at class time.    Education: Exercise & Equipment Safety: - Individual verbal instruction and demonstration of equipment use and safety with use of the equipment. Flowsheet Row Cardiac Rehab from 10/14/2023 in [facility] Cardiac and Pulmonary Rehab  Date 07/13/23  Educator [facility]  Instruction Review Code 1- Verbalizes Understanding    Education: Exercise Physiology & General  Exercise Guidelines: - Group verbal and written instruction with models to review the exercise physiology of the cardiovascular system and associated critical values. Provides general exercise guidelines with specific guidelines to those with heart or lung disease. Written material provided at class time.   Education: Flexibility, Balance, Mind/Body Relaxation: - Group verbal and visual presentation with interactive activity on the components of exercise prescription. Introduces F.I.T.T principle from ACSM for exercise prescriptions. Reviews F.I.T.T. principles of flexibility and balance exercise training including progression. Also discusses the mind body connection.  Reviews various relaxation techniques to help reduce and manage stress (i.e. Deep breathing, progressive muscle relaxation, and visualization). Balance handout provided to take home. Written material provided at class time.   Activity Barriers & Risk Stratification:  Activity Barriers & Cardiac Risk Stratification - 07/13/23 1148       Activity Barriers & Cardiac Risk Stratification   Activity Barriers Joint Problems    Cardiac Risk Stratification Low          6 Minute Walk:  6 Minute Walk     Row Name 07/13/23 1145 09/30/23 1414       6 Minute Walk   Phase Initial Discharge    Distance 1555 feet 2060 feet    Distance % Change -- 32 %    Distance Feet Change -- 505 ft    Walk Time 6 minutes 6 minutes    # of Rest Breaks 0 0    MPH 2.9 3.9    METS 3.96 4.97    RPE 9 9    Perceived Dyspnea  0 0    VO2 Peak 13.87 17.41    Symptoms No No    Resting HR 102 bpm 97 bpm    Resting BP 128/72 122/80    Resting Oxygen Saturation  95 % --    Exercise Oxygen Saturation  during 6 min walk 97 % --    Max Ex. HR 114 bpm 123 bpm    Max Ex. BP 156/74 160/88    2 Minute Post BP 134/72 140/80       Oxygen Initial Assessment:   Oxygen Re-Evaluation:   Oxygen Discharge (Final Oxygen Re-Evaluation):   Initial  Exercise Prescription:  Initial Exercise Prescription - 07/13/23 1100       Date of Initial Exercise RX and Referring Provider   Date 07/13/23    Referring Provider Dr. Georgine Alemu      Oxygen   Maintain Oxygen Saturation 88% or higher      Treadmill   MPH 3    Grade 2    Minutes 15    METs 4.12      Elliptical   Level 1    Speed 3    Minutes 15    METs 3.96      Rower   Level 3    Watts 25    Minutes 15    METs 3.96      Prescription Details   Duration Progress  to 30 minutes of continuous aerobic without signs/symptoms of physical distress      Intensity   THRR 40-80% of Max Heartrate 124-147    Ratings of Perceived Exertion 11-13    Perceived Dyspnea 0-4      Progression   Progression Continue to progress workloads to maintain intensity without signs/symptoms of physical distress.      Resistance Training   Training Prescription Yes    Weight 5lb    Reps 10-15          Perform Capillary Blood Glucose checks as needed.  Exercise Prescription Changes:   Exercise Prescription Changes     Row Name 07/13/23 1100 07/23/23 1500 08/05/23 1100 08/18/23 1300 08/26/23 1400     Response to Exercise   Blood Pressure (Admit) 128/72 134/72 128/58 144/78 --   Blood Pressure (Exercise) 156/74 156/76 144/64 150/76 --   Blood Pressure (Exit) 134/72 126/66 122/62 114/68 --   Heart Rate (Admit) 102 bpm 109 bpm 105 bpm 94 bpm --   Heart Rate (Exercise) 114 bpm 145 bpm 128 bpm 134 bpm --   Heart Rate (Exit) 97 bpm 100 bpm 104 bpm 86 bpm --   Oxygen Saturation (Admit) 95 % -- -- -- --   Oxygen Saturation (Exercise) 97 % -- -- -- --   Oxygen Saturation (Exit) 97 % -- -- -- --   Rating of Perceived Exertion (Exercise) 9 -- 14 15 --   Perceived Dyspnea (Exercise) 0 -- -- -- --   Symptoms none none none none --   Comments results -- First 2 weeks of exercise sessions -- --   Duration -- -- Continue with 30 min of aerobic exercise without signs/symptoms of physical  distress. Continue with 30 min of aerobic exercise without signs/symptoms of physical distress. Continue with 30 min of aerobic exercise without signs/symptoms of physical distress.   Intensity -- -- THRR unchanged THRR unchanged THRR unchanged     Progression   Progression -- Continue to progress workloads to maintain intensity without signs/symptoms of physical distress. Continue to progress workloads to maintain intensity without signs/symptoms of physical distress. Continue to progress workloads to maintain intensity without signs/symptoms of physical distress. Continue to progress workloads to maintain intensity without signs/symptoms of physical distress.   Average METs -- 3.12 4.31 5.14 5.14     Resistance Training   Training Prescription -- Yes Yes Yes Yes   Weight -- 5lb 5lb 5lb 5lb   Reps -- 10-15 10-15 10-15 10-15     Interval Training   Interval Training -- -- No No No     Treadmill   MPH -- 3 3.2 3.3 3.3   Grade -- 2 3 5 5    Minutes -- 15 15 15 15    METs -- 3.71 4.77 5.8 5.8     Elliptical   Level -- 1 -- 1 1   Speed -- 3 -- 3 3   Minutes -- 15 -- 15 15   METs -- 2.5 -- -- --     Biostep-RELP   Level -- -- -- 6 6   SPM -- -- -- 50 50   Minutes -- -- -- 15 15     Rower   Level -- -- 9 9 9    Watts -- -- 67 67 67   Minutes -- -- 15 15 15    METs -- -- 5.62 5.66 5.66     Home Exercise Plan   Plans to continue exercise at -- -- -- --  Home (comment)  walk and use hand weights   Frequency -- -- -- -- Add 2 additional days to program exercise sessions.   Initial Home Exercises Provided -- -- -- -- 08/26/23     Oxygen   Maintain Oxygen Saturation -- -- 88% or higher 88% or higher 88% or higher    Row Name 09/01/23 1300 09/16/23 1600 09/29/23 1400 10/15/23 1400       Response to Exercise   Blood Pressure (Admit) 122/78 132/70 110/68 110/82    Blood Pressure (Exit) 140/72 128/62 124/74 120/82    Heart Rate (Admit) 78 bpm 92 bpm 87 bpm 94 bpm    Heart Rate  (Exercise) 142 bpm 141 bpm 127 bpm 135 bpm    Heart Rate (Exit) 112 bpm 84 bpm 91 bpm 90 bpm    Rating of Perceived Exertion (Exercise) 15 15 14 15     Symptoms none none none none    Duration Continue with 30 min of aerobic exercise without signs/symptoms of physical distress. Continue with 30 min of aerobic exercise without signs/symptoms of physical distress. Continue with 30 min of aerobic exercise without signs/symptoms of physical distress. Continue with 30 min of aerobic exercise without signs/symptoms of physical distress.    Intensity THRR unchanged THRR unchanged THRR unchanged THRR unchanged      Progression   Progression Continue to progress workloads to maintain intensity without signs/symptoms of physical distress. Continue to progress workloads to maintain intensity without signs/symptoms of physical distress. Continue to progress workloads to maintain intensity without signs/symptoms of physical distress. Continue to progress workloads to maintain intensity without signs/symptoms of physical distress.    Average METs 5.16 5.7 4.5 4.56      Resistance Training   Training Prescription Yes Yes Yes Yes    Weight 5 lb 5 lb 5 lb 5 lb    Reps 10-15 10-15 10-15 10-15      Interval Training   Interval Training No No No No      Treadmill   MPH 3.6 3.7 3.5 3.2    Grade 5 6 4.5 5    Minutes 15 15 15 15     METs 6.24 6.89 5.85 5.66      Elliptical   Level 3 7 4 3     Speed -- 4 6 --    Minutes 15 15 15 15     METs 3.7 6 3.8 5.4      REL-XR   Level 8 9 -- --    Minutes 15 15 -- --    METs 4.7 5.4 -- --      Home Exercise Plan   Plans to continue exercise at Home (comment)  walk and use hand weights Home (comment)  walk and use hand weights Home (comment)  walk and use hand weights Home (comment)  walk and use hand weights    Frequency Add 2 additional days to program exercise sessions. Add 2 additional days to program exercise sessions. Add 2 additional days to program exercise  sessions. Add 2 additional days to program exercise sessions.    Initial Home Exercises Provided 08/26/23 08/26/23 08/26/23 08/26/23      Oxygen   Maintain Oxygen Saturation 88% or higher 88% or higher 88% or higher 88% or higher       Exercise Comments:   Exercise Comments     Row Name 07/17/23 1051           Exercise Comments First full day of exercise!  Patient was  oriented to gym and equipment including functions, settings, policies, and procedures.  Patient's individual exercise prescription and treatment plan were reviewed.  All starting workloads were established based on the results of the 6 minute walk test done at initial orientation visit.  The plan for exercise progression was also introduced and progression will be customized based on patient's performance and goals.          Exercise Goals and Review:   Exercise Goals     Row Name 07/13/23 1151             Exercise Goals   Increase Physical Activity Yes       Intervention Provide advice, education, support and counseling about physical activity/exercise needs.;Develop an individualized exercise prescription for aerobic and resistive training based on initial evaluation findings, risk stratification, comorbidities and participant's personal goals.       Expected Outcomes Short Term: Attend rehab on a regular basis to increase amount of physical activity.;Long Term: Add in home exercise to make exercise part of routine and to increase amount of physical activity.;Long Term: Exercising regularly at least 3-5 days a week.       Increase Strength and Stamina Yes       Intervention Develop an individualized exercise prescription for aerobic and resistive training based on initial evaluation findings, risk stratification, comorbidities and participant's personal goals.;Provide advice, education, support and counseling about physical activity/exercise needs.       Expected Outcomes Short Term: Increase workloads from  initial exercise prescription for resistance, speed, and METs.;Short Term: Perform resistance training exercises routinely during rehab and add in resistance training at home;Long Term: Improve cardiorespiratory fitness, muscular endurance and strength as measured by increased METs and functional capacity ( )       Able to understand and use rate of perceived exertion (RPE) scale Yes       Intervention Provide education and explanation on how to use RPE scale       Expected Outcomes Short Term: Able to use RPE daily in rehab to express subjective intensity level;Long Term:  Able to use RPE to guide intensity level when exercising independently       Able to understand and use Dyspnea scale Yes       Intervention Provide education and explanation on how to use Dyspnea scale       Expected Outcomes Short Term: Able to use Dyspnea scale daily in rehab to express subjective sense of shortness of breath during exertion;Long Term: Able to use Dyspnea scale to guide intensity level when exercising independently       Knowledge and understanding of Target Heart Rate Range (THRR) Yes       Intervention Provide education and explanation of THRR including how the numbers were predicted and where they are located for reference       Expected Outcomes Short Term: Able to state/look up THRR;Long Term: Able to use THRR to govern intensity when exercising independently;Short Term: Able to use daily as guideline for intensity in rehab       Able to check pulse independently Yes       Intervention Provide education and demonstration on how to check pulse in carotid and radial arteries.;Review the importance of being able to check your own pulse for safety during independent exercise       Expected Outcomes Short Term: Able to explain why pulse checking is important during independent exercise;Long Term: Able to check pulse independently and accurately  Understanding of Exercise Prescription Yes        Intervention Provide education, explanation, and written materials on patient's individual exercise prescription       Expected Outcomes Short Term: Able to explain program exercise prescription;Long Term: Able to explain home exercise prescription to exercise independently          Exercise Goals Re-Evaluation :  Exercise Goals Re-Evaluation     Row Name 07/17/23 1052 07/23/23 1529 08/05/23 1151 08/18/23 1346 08/26/23 1425     Exercise Goal Re-Evaluation   Exercise Goals Review Increase Physical Activity;Able to understand and use rate of perceived exertion (RPE) scale;Knowledge and understanding of Target Heart Rate Range (THRR);Understanding of Exercise Prescription;Increase Strength and Stamina;Able to understand and use Dyspnea scale;Able to check pulse independently Increase Physical Activity;Increase Strength and Stamina;Understanding of Exercise Prescription Increase Physical Activity;Increase Strength and Stamina;Understanding of Exercise Prescription Increase Physical Activity;Increase Strength and Stamina;Understanding of Exercise Prescription Increase Physical Activity;Increase Strength and Stamina;Able to understand and use rate of perceived exertion (RPE) scale;Able to understand and use Dyspnea scale;Knowledge and understanding of Target Heart Rate Range (THRR);Able to check pulse independently;Understanding of Exercise Prescription   Comments Reviewed RPE and dyspnea scale, THR and program prescription with pt today.  Pt voiced understanding and was given a copy of goals to take home. Review of Kim exercie progress completed . He is new to the program  He exercised with the TM at and 1% incline and ELP  at level Rhet is off to a good start in the program and completed his first 2 weeks of exercise sessions in this review. He already increased his treadmill workload to a speed of 3.2 mph and incline of 3%. He also already increased on the rower to level 9 with an average of 67  watts. We will continue to monitor his progress in the program. Devere is doing well in rehab. He increased his workload on the treadmill to a speed of 3.3 mph and incline of 5%. He maintained level 9 on the rower with an average watt of 67. He tried the elliptical at level 1. He also added the biostep at level 6. We will continue to monitor his progress in the program. Reviewed home exercise with pt today.  Pt plans to walk and use hand weights for exercise.  Reviewed THR, pulse, RPE, sign and symptoms, pulse oximetery and when to call 911 or MD.  Also discussed weather considerations and indoor options.  Pt voiced understanding.   Expected Outcomes Short: Use RPE daily to regulate intensity. Long: Follow program prescription in THR. STG continue to attend scheduled sessions,  progression as tolerated.  LTG  continue with exercise progression Short: Continue to follow current exercise prescription. Long: Continue exercise to improve strength and stamina. Short: Continue to progressively increase treadmill workload. Long: Continue exercise to improve strength and stamina. Short: add 1-2 days a week of exercise at home on off days of rehab. Long: maintain independent exercise routine upon graduation from cardiac rehab.    Row Name 09/01/23 1333 09/16/23 1608 09/24/23 1825 09/29/23 1414 09/30/23 1419     Exercise Goal Re-Evaluation   Exercise Goals Review Increase Physical Activity;Increase Strength and Stamina;Understanding of Exercise Prescription Increase Physical Activity;Increase Strength and Stamina;Understanding of Exercise Prescription Increase Physical Activity;Increase Strength and Stamina;Understanding of Exercise Prescription Increase Physical Activity;Increase Strength and Stamina;Understanding of Exercise Prescription Increase Physical Activity;Able to understand and use rate of perceived exertion (RPE) scale;Increase Strength and Stamina   Comments Miliano is doing  well in rehab. He recently  increased his speed on the treadmill to 3.6 mph while maintaining an incline of 5%. He also began using the XR at level 8 and improved to level 3 on the elliptical. We will continue to monitor his progress in the program. Gurfateh continues to do well in rehab. He was able to increase his treadmill workload to a speed of 3. and 6% incline. He was also able to increase from level 3 to 7 on the Eliptical. We will continue to monitor his progress in the program. Aqeel is doing well in rehab and doing well with his home exercise plan. He is walking and doing dumbbells for resistance and home one additional day a week. It models it like the program and it helps him keep his routine going. Beecher continues to do well in rehab. He is due for his post and will look to improve on it. He also continues to do well with his workloads on the treadmill and elliptical. We will continue to monitor his progress in the program. [name] improved by 32% (505 feet) on his 6 MWT! He plans to exercise at the [facility] upon graduation from cardiac rehab.   Expected Outcomes Short: Continue to progressively increase treadmill workload. Long: Continue exercise to improve strength and stamina. Short: Continue to increase both treadmill and eliptical workload. Long: Continue exercise to improev strength and stamina. Short: Continue with home exercise regimen in addition to rehab. Long: Continue exercise to improve strength and stamina. Short: Improve on post . Long: Continue to increase overall METs and stamina. Short: graduate from cardiac rehab. Long: maintain independent exercise routine upon graduation from cardiac rehab.    Row Name 10/15/23 1438             Exercise Goal Re-Evaluation   Exercise Goals Review Increase Physical Activity;Increase Strength and Stamina;Understanding of Exercise Prescription       Comments Caleel continues to do well in rehab and will graduate soon. He completed his post in  this review and improved by 32% with 2057ft total. He worked on the treadmill at a speed of 3. and incline of 5%. He also worked at level 3 on the elliptical. We will continue to monitor his progress in the program until graduation.       Expected Outcomes Short: Graduate. Long: Continue to exercise independently.          Discharge Exercise Prescription (Final Exercise Prescription Changes):  Exercise Prescription Changes - 10/15/23 1400       Response to Exercise   Blood Pressure (Admit) 110/82    Blood Pressure (Exit) 120/82    Heart Rate (Admit) 94 bpm    Heart Rate (Exercise) 135 bpm    Heart Rate (Exit) 90 bpm    Rating of Perceived Exertion (Exercise) 15    Symptoms none    Duration Continue with 30 min of aerobic exercise without signs/symptoms of physical distress.    Intensity THRR unchanged      Progression   Progression Continue to progress workloads to maintain intensity without signs/symptoms of physical distress.    Average METs 4.56      Resistance Training   Training Prescription Yes    Weight 5 lb    Reps 10-15      Interval Training   Interval Training No      Treadmill   MPH 3.2    Grade 5    Minutes 15    METs  5.66      Elliptical   Level 3    Minutes 15    METs 5.4      Home Exercise Plan   Plans to continue exercise at Home (comment)   walk and use hand weights   Frequency Add 2 additional days to program exercise sessions.    Initial Home Exercises Provided 08/26/23      Oxygen   Maintain Oxygen Saturation 88% or higher          Nutrition:  Target Goals: Understanding of nutrition guidelines, daily intake of sodium 1500mg , cholesterol 200mg , calories 30% from fat and 7% or less from saturated fats, daily to have 5 or more servings of fruits and vegetables.  Education: Nutrition 1 -Group instruction provided by verbal, written material, interactive activities, discussions, models, and posters to present general guidelines for  heart healthy nutrition including macronutrients, label reading, and promoting whole foods over processed counterparts. Education serves as P[employer] consultant of discussion of heart healthy eating for all. Written material provided at class time.    Education: Nutrition 2 -Group instruction provided by verbal, written material, interactive activities, discussions, models, and posters to present general guidelines for heart healthy nutrition including sodium, cholesterol, and saturated fat. Providing guidance of habit forming to improve blood pressure, cholesterol, and body weight. Written material provided at class time. Flowsheet Row Cardiac Rehab from 10/14/2023 in [facility] Cardiac and Pulmonary Rehab  Date 09/09/23  Educator jg  Instruction Review Code 1- Verbalizes Understanding      Biometrics:  Pre Biometrics - 07/13/23 1152       Pre Biometrics   Height 5' 11 (1.803 m)    Weight 228 lb 1.6 oz (103.5 kg)    Waist Circumference 42.5 inches    Hip Circumference 39 inches    Waist to Hip Ratio 1.09 %    BMI (Calculated) 31.83    Single Leg Stand 30 seconds          Post Biometrics - 09/30/23 1416        Post  Biometrics   Height 5' 11 (1.803 m)    Weight 229 lb 3.2 oz (104 kg)    Waist Circumference 43 inches    Hip Circumference 42 inches    Waist to Hip Ratio 1.02 %    BMI (Calculated) 31.98    Single Leg Stand 30 seconds          Nutrition Therapy Plan and Nutrition Goals:  Nutrition Therapy & Goals - 07/13/23 1336       Nutrition Therapy   Diet Cardiac, Low Na    Protein (specify units) 90    Fiber 30 grams    Whole Grain Foods 3 servings    Saturated Fats 15 max. grams    Fruits and Vegetables 5 servings/day    Sodium 2 grams      Personal Nutrition Goals   Nutrition Goal Eat 15-30gProtein and 30-60gCarbs at each meal.    Personal Goal #2 Read labels and reduce sodium intake to below 2300mg . Ideally 1500mg  per day.    Personal Goal #3 Reduce saturated fat,  less than 12g per day. Replace bad fats for more heart healthy fats.    Comments Patient drinking fruit juice and almond milk mostly. Reviewed his food recall, spoke with him about his high sugar intake and low nutrient intake. Educated on quality and quality of carbohydrates. Provided handout on mediterranean diet. Educated on types of fats, sources, and how to read facts  labels. Brainstormed several meal and snack ideas with foods he likes, focusing on building more balanced plates with more nutrient rich foods cutting back on added sugar.      Intervention Plan   Intervention Prescribe, educate and counsel regarding individualized specific dietary modifications aiming towards targeted core components such as weight, hypertension, lipid management, diabetes, heart failure and other comorbidities.;Nutrition handout(s) given to patient.    Expected Outcomes Short Term Goal: Understand basic principles of dietary content, such as calories, fat, sodium, cholesterol and nutrients.;Short Term Goal: A plan has been developed with personal nutrition goals set during dietitian appointment.;Long Term Goal: Adherence to prescribed nutrition plan.          Nutrition Assessments:  MEDIFICTS Score Key: >=70 Need to make dietary changes  40-70 Heart Healthy Diet <= 40 Therapeutic Level Cholesterol Diet  Flowsheet Row Cardiac Rehab from 10/01/2023 in [facility] Cardiac and Pulmonary Rehab  Picture Your Plate Total Score on Discharge 62   Picture Your Plate Scores: <59 Unhealthy dietary pattern with much room for improvement. 41-50 Dietary pattern unlikely to meet recommendations for good health and room for improvement. 51-60 More healthful dietary pattern, with some room for improvement.  >60 Healthy dietary pattern, although there may be some specific behaviors that could be improved.    Nutrition Goals Re-Evaluation:  Nutrition Goals Re-Evaluation     Row Name 08/05/23 1411 09/09/23 1355 09/24/23 1829          Goals   Comment Drevin reports that he does watch his sodium and has switched to diet sodas instead of regular. He has a protein drink at work and eats one main meal when he gets home. He tries to make sure that meal is balanced with carb, protein, and vegetables. He was encouraged to try to eat smaller more frequent meals but that is hard with his work schedule. Felis is doing better with his nutrition. He is watching his sodium and drinking diet sodas more. He is attending nutrition education class today. Leonard is doing very well with his nutrition goals. He has been working on adding in healthy and recommended amount of carbs and protein. He is watching his sodium and saturated fats. He enjoys making overnight oats and has some vegan meals he really likes. He has stopped drinking diet sodas and has switched to flavored mineral water.     Expected Outcome Short: continue watching sodium and try to add smaller more frequent meals. Long: maintain heart healthy diet. STG: Attend nutrtion education class and continue to cut back/eliminate sugary beverages. LTG: maintain heart healthy diet. STG: Keep up with watching sodium, saturated fats, and carb and protein amounts at each meal. LTG: maintain heart healthy diet.        Nutrition Goals Discharge (Final Nutrition Goals Re-Evaluation):  Nutrition Goals Re-Evaluation - 09/24/23 1829       Goals   Comment Cadyn is doing very well with his nutrition goals. He has been working on adding in healthy and recommended amount of carbs and protein. He is watching his sodium and saturated fats. He enjoys making overnight oats and has some vegan meals he really likes. He has stopped drinking diet sodas and has switched to flavored mineral water.    Expected Outcome STG: Keep up with watching sodium, saturated fats, and carb and protein amounts at each meal. LTG: maintain heart healthy diet.          Psychosocial: Target Goals: Acknowledge presence or  absence of significant depression and/or  stress, maximize coping skills, provide positive support system. Participant is able to verbalize types and ability to use techniques and skills needed for reducing stress and depression.   Education: Stress, Anxiety, and Depression - Group verbal and visual presentation to define topics covered.  Reviews how body is impacted by stress, anxiety, and depression.  Also discusses healthy ways to reduce stress and to treat/manage anxiety and depression. Written material provided at class time. Flowsheet Row Cardiac Rehab from 10/14/2023 in [facility] Cardiac and Pulmonary Rehab  Date 10/14/23  Educator kb  Instruction Review Code 1- [employer] Understanding    Education: Sleep Hygiene -Provides group verbal and written instruction about how sleep can affect your health.  Define sleep hygiene, discuss sleep cycles and impact of sleep habits. Review good sleep hygiene tips.   Initial Review & Psychosocial Screening:  Initial Psych Review & Screening - 07/08/23 1441       Initial Review   Current issues with Current Psychotropic Meds;Current Anxiety/Panic;Current Sleep Concerns      Family Dynamics   Good Support System? Yes    Comments Ritvik has some anxiety when he is in crowed areas when he is out. He was sleeping alot for awhile and now he is sleeping ok. He takes medication for his sleep and has been helping for the most part.      Barriers   Psychosocial barriers to participate in program The patient should benefit from training in stress management and relaxation.;There are no identifiable barriers or psychosocial needs.      Screening Interventions   Interventions Provide feedback about the scores to participant;To provide support and resources with identified psychosocial needs;Encouraged to exercise    Expected Outcomes Short Term goal: Utilizing psychosocial counselor, staff and physician to assist with identification of specific Stressors or  current issues interfering with healing process. Setting desired goal for each stressor or current issue identified.;Long Term Goal: Stressors or current issues are controlled or eliminated.;Short Term goal: Identification and review with participant of any Quality of Life or Depression concerns found by scoring the questionnaire.;Long Term goal: The participant improves quality of Life and PHQ9 Scores as seen by post scores and/or verbalization of changes          Quality of Life Scores:   Quality of Life - 10/01/23 1341       Quality of Life Scores   Health/Function Post 24.5 %    Socioeconomic Post 20.79 %    Psych/Spiritual Post 23.14 %    Family Post 15.6 %    GLOBAL Post 22.15 %         Scores of 19 and below usually indicate a poorer quality of life in these areas.  A difference of  2-3 points is a clinically meaningful difference.  A difference of 2-3 points in the total score of the Quality of Life Index has been associated with significant improvement in overall quality of life, self-image, physical symptoms, and general health in studies assessing change in quality of life.  PHQ-9: Review Flowsheet       10/01/2023 07/13/2023  Depression screen PHQ 2/9  Decreased Interest 0 0  Down, Depressed, Hopeless 0 0  PHQ - 2 Score 0 0  Altered sleeping 2 3  Tired, decreased energy 1 0  Change in appetite 0 0  Feeling bad or failure about yourself  0 0  Trouble concentrating 0 0  Moving slowly or fidgety/restless 0 0  Suicidal thoughts 0 0  PHQ-9 Score 3  3  Difficult doing work/chores Not difficult at all Not difficult at all   Interpretation of Total Score  Total Score Depression Severity:  1-4 = Minimal depression, 5-9 = Mild depression, 10-14 = Moderate depression, 15-19 = Moderately severe depression, 20-27 = Severe depression   Psychosocial Evaluation and Intervention:  Psychosocial Evaluation - 07/08/23 1444       Psychosocial Evaluation & Interventions    Interventions Encouraged to exercise with the program and follow exercise prescription;Relaxation education;Stress management education    Comments Tonatiuh has some anxiety when he is in crowed areas when he is out. He was sleeping alot for awhile and now he is sleeping ok. He takes medication for his sleep and has been helping for the most part.    Expected Outcomes Short: Start [facility] to help with mood. Long: Maintain a healthy mental state    Continue Psychosocial Services  Follow up required by staff          Psychosocial Re-Evaluation:  Psychosocial Re-Evaluation     Row Name 08/05/23 1422 09/09/23 1357 09/24/23 1827         Psychosocial Re-Evaluation   Current issues with Current Psychotropic Meds;Current Anxiety/Panic;Current Sleep Concerns Current Psychotropic Meds;Current Anxiety/Panic;Current Sleep Concerns Current Psychotropic Meds;Current Sleep Concerns     Comments Keena stated that he does not sleep well but that is nothing new and there have been no changes recently in sleep patterns. He did start back to work which has caused stress levels to increase some but he states he is mostly able to manage his stress levels. He continue to take meds for his mood and to control anxiety and he reports that they are working and all of that has been stable. Keir states he still does not sleep well and there has been no changes in a long time with his sleep patterns. He has talked to his doctor about his concerns and has tried different sleep aids over the years. He states he does have work stress but from the most part he is able to deal with it. He does report having  support system and the resources he needs to manage his mental health. He states that the exercise he is doing during cardiac rehab does help his mental state. Jovanne states he does not have any new stress. He is still not sleeping well, which does not help with his long 10 hours a day at work. He will occasionally have a good  night of sleep. He is consistent with taking his psychotropic meds daily. He has a god support system in place.     Expected Outcomes Short: continue to adjust to being back to work and managing that new stress. Long: continue to exercise for the mental health benefits of exericse. Short: continue to attend cardiac rehab for the mental health benefits of exercise. Long: manage stress levels and maintain exercise routine upon graduation to help with stress. Short: continue to attend cardiac rehab for the mental health benefits of exercise to help manage his stress levels. Long: continue manage stress levels and maintain exercise routine upon graduation to help with stress.     Interventions Encouraged to attend Cardiac Rehabilitation for the exercise Encouraged to attend Cardiac Rehabilitation for the exercise Encouraged to attend Cardiac Rehabilitation for the exercise     Continue Psychosocial Services  Follow up required by staff Follow up required by staff Follow up required by staff        Psychosocial Discharge (Final Psychosocial Re-Evaluation):  Psychosocial Re-Evaluation - 09/24/23 1827       Psychosocial Re-Evaluation   Current issues with Current Psychotropic Meds;Current Sleep Concerns    Comments Jarmar states he does not have any new stress. He is still not sleeping well, which does not help with his long 10 hours a day at work. He will occasionally have a good night of sleep. He is consistent with taking his psychotropic meds daily. He has a god support system in place.    Expected Outcomes Short: continue to attend cardiac rehab for the mental health benefits of exercise to help manage his stress levels. Long: continue manage stress levels and maintain exercise routine upon graduation to help with stress.    Interventions Encouraged to attend Cardiac Rehabilitation for the exercise    Continue Psychosocial Services  Follow up required by staff          Vocational  Rehabilitation: Provide vocational rehab assistance to qualifying candidates.   Vocational Rehab Evaluation & Intervention:   Education: Education Goals: Education classes will be provided on a variety of topics geared toward better understanding of heart health and risk factor modification. Participant will state understanding/return demonstration of topics presented as noted by education test scores.  Learning Barriers/Preferences:  Learning Barriers/Preferences - 07/08/23 1438       Learning Barriers/Preferences   Learning Barriers None    Learning Preferences None          General Cardiac Education Topics:  AED/CPR: - Group verbal and written instruction with the use of models to demonstrate the basic use of the AED with the basic ABC's of resuscitation.   Test and Procedures: - Group verbal and visual presentation and models provide information about basic cardiac anatomy and function. Reviews the testing methods done to diagnose heart disease and the outcomes of the test results. Describes the treatment choices: Medical Management, Angioplasty, or Coronary Bypass Surgery for treating various heart conditions including Myocardial Infarction, Angina, Valve Disease, and Cardiac Arrhythmias. Written material provided at class time. Flowsheet Row Cardiac Rehab from 10/14/2023 in [facility] Cardiac and Pulmonary Rehab  Education need identified 07/13/23    Medication Safety: - Group verbal and visual instruction to review commonly prescribed medications for heart and lung disease. Reviews the medication, class of the drug, and side effects. Includes the steps to properly store meds and maintain the prescription regimen. Written material provided at class time. Flowsheet Row Cardiac Rehab from 10/14/2023 in [facility] Cardiac and Pulmonary Rehab  Date 09/23/23  Educator [facility]  Instruction Review Code 1- Verbalizes Understanding    Intimacy: - Group verbal instruction through game format to  discuss how heart and lung disease can affect sexual intimacy. Written material provided at class time. Flowsheet Row Cardiac Rehab from 10/14/2023 in [facility] Cardiac and Pulmonary Rehab  Date 08/26/23  Educator [facility]  Instruction Review Code 1- Verbalizes Understanding    Know Your Numbers and Heart Failure: - Group verbal and visual instruction to discuss disease risk factors for cardiac and pulmonary disease and treatment options.  Reviews associated critical values for Overweight/Obesity, Hypertension, Cholesterol, and Diabetes.  Discusses basics of heart failure: signs/symptoms and treatments.  Introduces Heart Failure Zone chart for action plan for heart failure. Written material provided at class time. Flowsheet Row Cardiac Rehab from 10/14/2023 in [facility] Cardiac and Pulmonary Rehab  Date 07/22/23  Educator sb  Instruction Review Code 1- Verbalizes Understanding    Infection Prevention: - Provides verbal and written material to individual with discussion of infection control  including proper hand washing and proper equipment cleaning during exercise session. Flowsheet Row Cardiac Rehab from 10/14/2023 in [facility] Cardiac and Pulmonary Rehab  Date 07/13/23  Educator [facility]  Instruction Review Code 1- Verbalizes Understanding    Falls Prevention: - Provides verbal and written material to individual with discussion of falls prevention and safety. Flowsheet Row Cardiac Rehab from 10/14/2023 in [facility] Cardiac and Pulmonary Rehab  Date 07/13/23  Educator [facility]  Instruction Review Code 1- Verbalizes Understanding    Other: -Provides group and verbal instruction on various topics (see comments)   Knowledge Questionnaire Score:  Knowledge Questionnaire Score - 10/01/23 1340       Knowledge Questionnaire Score   Pre Score 23/26    Post Score 23/26          Core Components/Risk Factors/Patient Goals at Admission:  Personal Goals and Risk Factors at Admission - 07/08/23 1437       Core  Components/Risk Factors/Patient Goals on Admission    Weight Management Yes;Weight Loss    Intervention Weight Management: Develop a combined nutrition and exercise program designed to reach desired caloric intake, while maintaining appropriate intake of nutrient and fiber, sodium and fats, and appropriate energy expenditure required for the weight goal.;Weight Management: Provide education and appropriate resources to help participant work on and attain dietary goals.;Obesity: Provide education and appropriate resources to help participant work on and attain dietary goals.;Weight Management/Obesity: Establish reasonable short term and long term weight goals.    Expected Outcomes Short Term: Continue to assess and modify interventions until short term weight is achieved;Weight Loss: Understanding of general recommendations for a balanced deficit meal plan, which promotes 1-2 lb weight loss per week and includes a negative energy balance of [phone] kcal/d;Understanding recommendations for meals to include 15-35% energy as protein, 25-35% energy from fat, 35-60% energy from carbohydrates, less than 200mg  of dietary cholesterol, 20-35 gm of total fiber daily;Understanding of distribution of calorie intake throughout the day with the consumption of 4-5 meals/snacks    Tobacco Cessation Yes    Number of packs per day 0    Intervention Assist the participant in steps to quit. Provide individualized education and counseling about committing to Tobacco Cessation, relapse prevention, and pharmacological support that can be provided by physician.;Education officer, environmental, assist with locating and accessing local/national Quit Smoking programs, and support quit date choice.    Expected Outcomes Short Term: Will demonstrate readiness to quit, by selecting a quit date.;Long Term: Complete abstinence from all tobacco products for at least 12 months from quit date.;Short Term: Will quit all tobacco product use,  adhering to prevention of relapse plan.    Diabetes Yes    Intervention Provide education about signs/symptoms and action to take for hypo/hyperglycemia.;Provide education about proper nutrition, including hydration, and aerobic/resistive exercise prescription along with prescribed medications to achieve blood glucose in normal ranges: Fasting glucose 65-99 mg/dL    Expected Outcomes Long Term: Attainment of HbA1C < 7%.;Short Term: Participant verbalizes understanding of the signs/symptoms and immediate care of hyper/hypoglycemia, proper foot care and importance of medication, aerobic/resistive exercise and nutrition plan for blood glucose control.    Hypertension Yes    Intervention Provide education on lifestyle modifcations including regular physical activity/exercise, weight management, moderate sodium restriction and increased consumption of fresh fruit, vegetables, and low fat dairy, alcohol moderation, and smoking cessation.;Monitor prescription use compliance.    Expected Outcomes Short Term: Continued assessment and intervention until BP is < 140/49mm HG in hypertensive participants. < 130/34mm HG in  hypertensive participants with diabetes, heart failure or chronic kidney disease.;Long Term: Maintenance of blood pressure at goal levels.          Education:Diabetes - Individual verbal and written instruction to review signs/symptoms of diabetes, desired ranges of glucose level fasting, after meals and with exercise. Acknowledge that pre and post exercise glucose checks will be done for 3 sessions at entry of program. Flowsheet Row Cardiac Rehab from 10/14/2023 in [facility] Cardiac and Pulmonary Rehab  Date 07/13/23  Educator [facility]  Instruction Review Code 1- Verbalizes Understanding    Core Components/Risk Factors/Patient Goals Review:   Goals and Risk Factor Review     Row Name 08/05/23 1418 09/09/23 1406 09/24/23 1832         Core Components/Risk Factors/Patient Goals Review   Personal  Goals Review Hypertension;Diabetes Weight Management/Obesity;Tobacco Cessation;Hypertension Weight Management/Obesity;Diabetes;Hypertension     Review Patient was asked about monitoring blood glucose and he stated no one ever told him to check his blood sugars at home and he doesn't have a monitor. He was encouraged to aske his doctor about that and see if he can get a blood glucose monitor for home if his doctor wants him to be checking blood sugars. He does take his DM meds regularly and follows up with doctor for all lab work and appointments. He does have a blood pressure cuff and was encouraged to start checking blood pressure at home. Auston reports that he still has not gotten in the habbit of checking BP at home. He was encouraged to do so. He reports he weight has been mostly steady but he has lost weight slow and steady over the last several years. He reports still using dip but that he has been cutting back on the amount he dips. He does not have a quit date set but just keeps trying to cut back. His inurance company asked him if he wanted them to send him a blood glucose meter and he agreed but has not yet recieved it. Curties is doing great with his weight loss goal and reports his weight is trending downward and weighed in today at 228.4 lbs. He is down 3 lbs this week. He has begun tracking his blood pressure regularly. He did not recieve a glucose meter as discussed in the last review as his doctor states he does not need it.     Expected Outcomes Short: talk to doctor about getting a blood glucose meter. start checking blood pressure at home. Long: control cardiac risk factors. Short: follow up about recieving a blood glucose meter. Continue to cut back on dipping. Long: become become independent with monitoring blood pressure and blood sugar. Become tobacco free. Short: Continue with tracking blood pressure and weight. Long: Continue to follow healthy habits and montioring blood pressure and weight.         Core Components/Risk Factors/Patient Goals at Discharge (Final Review):   Goals and Risk Factor Review - 09/24/23 1832       Core Components/Risk Factors/Patient Goals Review   Personal Goals Review Weight Management/Obesity;Diabetes;Hypertension    Review Murl is doing great with his weight loss goal and reports his weight is trending downward and weighed in today at 228.4 lbs. He is down 3 lbs this week. He has begun tracking his blood pressure regularly. He did not recieve a glucose meter as discussed in the last review as his doctor states he does not need it.    Expected Outcomes Short: Continue with tracking blood pressure and  weight. Long: Continue to follow healthy habits and montioring blood pressure and weight.          ITP Comments:  ITP Comments     Row Name 07/08/23 1440 07/13/23 1145 07/17/23 1051 07/22/23 0943 08/19/23 0824   ITP Comments Virtual Visit completed. Patient informed on EP and RD appointment and 6 Minute walk test. Patient also informed of patient health questionnaires on My Chart. Patient Verbalizes understanding. Visit diagnosis can be found in [facility] 06/01/2023. Completed and gym orientation for cardiac rehab. Initial ITP created and sent for review to Dr. Oneil Pinal, Medical Director. First full day of exercise!  Patient was oriented to gym and equipment including functions, settings, policies, and procedures.  Patient's individual exercise prescription and treatment plan were reviewed.  All starting workloads were established based on the results of the 6 minute walk test done at initial orientation visit.  The plan for exercise progression was also introduced and progression will be customized based on patient's performance and goals. 30 Day review completed. Medical Director ITP review done, changes made as directed, and signed approval by Medical Director. new to program 30 Day review completed. Medical Director ITP review done, changes made as  directed, and signed approval by Medical Director.    Row Name 09/16/23 0901 10/14/23 1158 10/21/23 1402       ITP Comments 30 Day review completed. Medical Director ITP review done, changes made as directed, and signed approval by Medical Director. 30 Day review completed. Medical Director ITP review done, changes made as directed, and signed approval by Medical Director. Marvin graduated today from  rehab with 36 sessions completed.  Details of the patient's exercise prescription and what He needs to do in order to continue the prescription and progress were discussed with patient.  Patient was given a copy of prescription and goals.  Patient verbalized understanding. Ichiro plans to continue to exercise by walking and using hand weights.        Comments: Discharge ITP

## 2023-10-22 ENCOUNTER — Encounter

## 2023-12-18 ENCOUNTER — Other Ambulatory Visit: Payer: Self-pay | Admitting: Medical Genetics

## 2024-02-25 ENCOUNTER — Other Ambulatory Visit: Payer: Self-pay | Admitting: Medical Genetics

## 2024-02-25 DIAGNOSIS — Z006 Encounter for examination for normal comparison and control in clinical research program: Secondary | ICD-10-CM
